# Patient Record
Sex: Female | Born: 1950 | Race: White | Hispanic: No | Marital: Married | Smoking: Former smoker
Health system: Southern US, Community
[De-identification: ages and names within clinical notes are randomized; demographics above are authoritative.]

## PROBLEM LIST (undated history)

## (undated) DIAGNOSIS — Z5189 Encounter for other specified aftercare: Secondary | ICD-10-CM

## (undated) DIAGNOSIS — IMO0002 Reserved for concepts with insufficient information to code with codable children: Secondary | ICD-10-CM

## (undated) DIAGNOSIS — Z87440 Personal history of urinary (tract) infections: Secondary | ICD-10-CM

## (undated) DIAGNOSIS — G629 Polyneuropathy, unspecified: Secondary | ICD-10-CM

## (undated) DIAGNOSIS — Z9221 Personal history of antineoplastic chemotherapy: Secondary | ICD-10-CM

## (undated) DIAGNOSIS — I10 Essential (primary) hypertension: Secondary | ICD-10-CM

## (undated) DIAGNOSIS — IMO0001 Reserved for inherently not codable concepts without codable children: Secondary | ICD-10-CM

## (undated) DIAGNOSIS — M1990 Unspecified osteoarthritis, unspecified site: Secondary | ICD-10-CM

## (undated) DIAGNOSIS — Z923 Personal history of irradiation: Secondary | ICD-10-CM

## (undated) DIAGNOSIS — C541 Malignant neoplasm of endometrium: Secondary | ICD-10-CM

## (undated) HISTORY — DX: Essential (primary) hypertension: I10

## (undated) HISTORY — DX: Unspecified osteoarthritis, unspecified site: M19.90

## (undated) HISTORY — DX: Malignant neoplasm of endometrium: C54.1

## (undated) HISTORY — PX: COLONOSCOPY: SHX174

## (undated) HISTORY — DX: Encounter for other specified aftercare: Z51.89

## (undated) HISTORY — DX: Polyneuropathy, unspecified: G62.9

## (undated) HISTORY — DX: Personal history of antineoplastic chemotherapy: Z92.21

## (undated) HISTORY — DX: Reserved for concepts with insufficient information to code with codable children: IMO0002

## (undated) HISTORY — DX: Personal history of urinary (tract) infections: Z87.440

## (undated) HISTORY — DX: Personal history of irradiation: Z92.3

## (undated) HISTORY — DX: Reserved for inherently not codable concepts without codable children: IMO0001

---

## 1998-09-21 ENCOUNTER — Other Ambulatory Visit: Admission: RE | Admit: 1998-09-21 | Discharge: 1998-09-21 | Payer: Self-pay | Admitting: Obstetrics and Gynecology

## 1999-02-23 ENCOUNTER — Other Ambulatory Visit: Admission: RE | Admit: 1999-02-23 | Discharge: 1999-02-23 | Payer: Self-pay | Admitting: Obstetrics and Gynecology

## 1999-02-23 ENCOUNTER — Encounter (INDEPENDENT_AMBULATORY_CARE_PROVIDER_SITE_OTHER): Payer: Self-pay | Admitting: Specialist

## 1999-06-15 ENCOUNTER — Other Ambulatory Visit: Admission: RE | Admit: 1999-06-15 | Discharge: 1999-06-15 | Payer: Self-pay | Admitting: Obstetrics and Gynecology

## 1999-08-31 ENCOUNTER — Other Ambulatory Visit: Admission: RE | Admit: 1999-08-31 | Discharge: 1999-08-31 | Payer: Self-pay | Admitting: Obstetrics and Gynecology

## 2000-10-29 ENCOUNTER — Other Ambulatory Visit: Admission: RE | Admit: 2000-10-29 | Discharge: 2000-10-29 | Payer: Self-pay | Admitting: Obstetrics and Gynecology

## 2001-11-06 ENCOUNTER — Other Ambulatory Visit: Admission: RE | Admit: 2001-11-06 | Discharge: 2001-11-06 | Payer: Self-pay | Admitting: Obstetrics and Gynecology

## 2002-12-10 ENCOUNTER — Other Ambulatory Visit: Admission: RE | Admit: 2002-12-10 | Discharge: 2002-12-10 | Payer: Self-pay | Admitting: Obstetrics and Gynecology

## 2004-01-27 ENCOUNTER — Other Ambulatory Visit: Admission: RE | Admit: 2004-01-27 | Discharge: 2004-01-27 | Payer: Self-pay | Admitting: Obstetrics and Gynecology

## 2009-03-15 ENCOUNTER — Emergency Department (HOSPITAL_COMMUNITY): Admission: EM | Admit: 2009-03-15 | Discharge: 2009-03-15 | Payer: Self-pay | Admitting: Family Medicine

## 2009-05-31 DIAGNOSIS — Z5189 Encounter for other specified aftercare: Secondary | ICD-10-CM

## 2009-05-31 DIAGNOSIS — IMO0001 Reserved for inherently not codable concepts without codable children: Secondary | ICD-10-CM

## 2009-05-31 HISTORY — PX: OTHER SURGICAL HISTORY: SHX169

## 2009-05-31 HISTORY — DX: Encounter for other specified aftercare: Z51.89

## 2009-05-31 HISTORY — PX: UPPER GASTROINTESTINAL ENDOSCOPY: SHX188

## 2009-05-31 HISTORY — DX: Reserved for inherently not codable concepts without codable children: IMO0001

## 2009-06-23 ENCOUNTER — Inpatient Hospital Stay (HOSPITAL_COMMUNITY): Admission: AD | Admit: 2009-06-23 | Discharge: 2009-06-26 | Payer: Self-pay | Admitting: Internal Medicine

## 2009-06-24 ENCOUNTER — Encounter (INDEPENDENT_AMBULATORY_CARE_PROVIDER_SITE_OTHER): Payer: Self-pay | Admitting: Internal Medicine

## 2009-06-24 ENCOUNTER — Encounter: Payer: Self-pay | Admitting: Internal Medicine

## 2009-06-26 ENCOUNTER — Ambulatory Visit: Payer: Self-pay | Admitting: Gastroenterology

## 2010-08-01 DIAGNOSIS — Z87440 Personal history of urinary (tract) infections: Secondary | ICD-10-CM

## 2010-08-01 HISTORY — DX: Personal history of urinary (tract) infections: Z87.440

## 2010-08-02 ENCOUNTER — Other Ambulatory Visit: Payer: Self-pay | Admitting: Emergency Medicine

## 2010-08-02 ENCOUNTER — Emergency Department (HOSPITAL_COMMUNITY)
Admission: EM | Admit: 2010-08-02 | Discharge: 2010-08-02 | Disposition: A | Discharge status: Home / Self Care | Payer: PRIVATE HEALTH INSURANCE | Attending: Emergency Medicine | Admitting: Emergency Medicine

## 2010-08-02 DIAGNOSIS — R55 Syncope and collapse: Secondary | ICD-10-CM | POA: Insufficient documentation

## 2010-08-02 DIAGNOSIS — Z8639 Personal history of other endocrine, nutritional and metabolic disease: Secondary | ICD-10-CM | POA: Insufficient documentation

## 2010-08-02 DIAGNOSIS — R1110 Vomiting, unspecified: Secondary | ICD-10-CM | POA: Insufficient documentation

## 2010-08-02 DIAGNOSIS — E119 Type 2 diabetes mellitus without complications: Secondary | ICD-10-CM | POA: Insufficient documentation

## 2010-08-02 DIAGNOSIS — I10 Essential (primary) hypertension: Secondary | ICD-10-CM | POA: Insufficient documentation

## 2010-08-02 DIAGNOSIS — N390 Urinary tract infection, site not specified: Secondary | ICD-10-CM | POA: Insufficient documentation

## 2010-08-02 DIAGNOSIS — Z862 Personal history of diseases of the blood and blood-forming organs and certain disorders involving the immune mechanism: Secondary | ICD-10-CM | POA: Insufficient documentation

## 2010-08-02 DIAGNOSIS — R509 Fever, unspecified: Secondary | ICD-10-CM | POA: Insufficient documentation

## 2010-08-02 LAB — URINALYSIS, ROUTINE W REFLEX MICROSCOPIC
Nitrite: NEGATIVE
Specific Gravity, Urine: 1.016 (ref 1.005–1.030)
Urobilinogen, UA: 0.2 mg/dL (ref 0.0–1.0)

## 2010-08-02 LAB — URINE MICROSCOPIC-ADD ON

## 2010-09-19 LAB — DIFFERENTIAL
Basophils Absolute: 0 10*3/uL (ref 0.0–0.1)
Eosinophils Relative: 0 % (ref 0–5)
Lymphocytes Relative: 7 % — ABNORMAL LOW (ref 12–46)
Lymphs Abs: 1.2 10*3/uL (ref 0.7–4.0)
Monocytes Absolute: 1.1 10*3/uL — ABNORMAL HIGH (ref 0.1–1.0)
Monocytes Relative: 6 % (ref 3–12)

## 2010-09-19 LAB — COMPREHENSIVE METABOLIC PANEL
ALT: 20 U/L (ref 0–35)
AST: 26 U/L (ref 0–37)
Albumin: 4.1 g/dL (ref 3.5–5.2)
CO2: 22 mEq/L (ref 19–32)
Calcium: 9.7 mg/dL (ref 8.4–10.5)
GFR calc non Af Amer: 39 mL/min — ABNORMAL LOW (ref >60)
Sodium: 131 mEq/L — ABNORMAL LOW (ref 135–145)

## 2010-09-19 LAB — CBC
MCH: 30.5 pg (ref 26.0–34.0)
Platelets: 246 10*3/uL (ref 150–400)
RBC: 4.06 MIL/uL (ref 3.87–5.11)
RDW: 14 % (ref 11.5–15.5)
WBC: 16.9 10*3/uL — ABNORMAL HIGH (ref 4.0–10.5)

## 2010-09-19 LAB — URINE CULTURE

## 2010-10-01 LAB — IRON AND TIBC: UIBC: 407 ug/dL

## 2010-10-01 LAB — COMPREHENSIVE METABOLIC PANEL
ALT: 30 U/L (ref 0–35)
AST: 33 U/L (ref 0–37)
AST: 49 U/L — ABNORMAL HIGH (ref 0–37)
Albumin: 3.1 g/dL — ABNORMAL LOW (ref 3.5–5.2)
CO2: 22 mEq/L (ref 19–32)
Calcium: 9.1 mg/dL (ref 8.4–10.5)
Calcium: 9.4 mg/dL (ref 8.4–10.5)
Chloride: 107 mEq/L (ref 96–112)
Chloride: 112 mEq/L (ref 96–112)
Creatinine, Ser: 1.23 mg/dL — ABNORMAL HIGH (ref 0.4–1.2)
GFR calc Af Amer: 55 mL/min — ABNORMAL LOW (ref >60)
GFR calc non Af Amer: 42 mL/min — ABNORMAL LOW (ref >60)
Potassium: 5.2 mEq/L — ABNORMAL HIGH (ref 3.5–5.1)
Total Protein: 6 g/dL (ref 6.0–8.3)

## 2010-10-01 LAB — CBC
HCT: 26.7 % — ABNORMAL LOW (ref 36.0–46.0)
Hemoglobin: 9.3 g/dL — ABNORMAL LOW (ref 12.0–15.0)
MCHC: 33.5 g/dL (ref 30.0–36.0)
MCHC: 34.1 g/dL (ref 30.0–36.0)
MCV: 94.4 fL (ref 78.0–100.0)
MCV: 94.5 fL (ref 78.0–100.0)
MCV: 97.9 fL (ref 78.0–100.0)
Platelets: 221 10*3/uL (ref 150–400)
Platelets: 252 10*3/uL (ref 150–400)
RDW: 17.1 % — ABNORMAL HIGH (ref 11.5–15.5)
RDW: 17.3 % — ABNORMAL HIGH (ref 11.5–15.5)
RDW: 18.4 % — ABNORMAL HIGH (ref 11.5–15.5)
RDW: 19.4 % — ABNORMAL HIGH (ref 11.5–15.5)
WBC: 6.9 10*3/uL (ref 4.0–10.5)
WBC: 8.3 10*3/uL (ref 4.0–10.5)

## 2010-10-01 LAB — BASIC METABOLIC PANEL
BUN: 15 mg/dL (ref 6–23)
CO2: 21 mEq/L (ref 19–32)
Creatinine, Ser: 1.3 mg/dL — ABNORMAL HIGH (ref 0.4–1.2)
GFR calc non Af Amer: 38 mL/min — ABNORMAL LOW (ref >60)
GFR calc non Af Amer: 42 mL/min — ABNORMAL LOW (ref >60)
Glucose, Bld: 102 mg/dL — ABNORMAL HIGH (ref 70–99)
Glucose, Bld: 165 mg/dL — ABNORMAL HIGH (ref 70–99)
Potassium: 5 mEq/L (ref 3.5–5.1)
Sodium: 134 mEq/L — ABNORMAL LOW (ref 135–145)

## 2010-10-01 LAB — CK TOTAL AND CKMB (NOT AT ARMC)
CK, MB: 1.2 ng/mL (ref 0.3–4.0)
CK, MB: 1.6 ng/mL (ref 0.3–4.0)
Total CK: 58 U/L (ref 7–177)

## 2010-10-01 LAB — CROSSMATCH: Antibody Screen: NEGATIVE

## 2010-10-01 LAB — TROPONIN I: Troponin I: 0.01 ng/mL (ref 0.00–0.06)

## 2010-10-01 LAB — PROTIME-INR
INR: 1.01 (ref 0.00–1.49)
Prothrombin Time: 13.2 seconds (ref 11.6–15.2)

## 2010-10-01 LAB — GLUCOSE, CAPILLARY
Glucose-Capillary: 119 mg/dL — ABNORMAL HIGH (ref 70–99)
Glucose-Capillary: 121 mg/dL — ABNORMAL HIGH (ref 70–99)
Glucose-Capillary: 209 mg/dL — ABNORMAL HIGH (ref 70–99)
Glucose-Capillary: 97 mg/dL (ref 70–99)

## 2010-10-01 LAB — FOLATE

## 2010-10-01 LAB — FERRITIN: Ferritin: 70 ng/mL (ref 10–291)

## 2010-10-01 LAB — RETICULOCYTES
RBC.: 2.07 MIL/uL — ABNORMAL LOW (ref 3.87–5.11)
Retic Ct Pct: 12 % — ABNORMAL HIGH (ref 0.4–3.1)

## 2010-10-01 LAB — HEMOCCULT GUIAC POC 1CARD (OFFICE): Fecal Occult Bld: NEGATIVE

## 2011-01-15 ENCOUNTER — Other Ambulatory Visit: Payer: Self-pay | Admitting: Obstetrics and Gynecology

## 2011-01-23 ENCOUNTER — Ambulatory Visit: Payer: PRIVATE HEALTH INSURANCE | Attending: Gynecologic Oncology | Admitting: Gynecologic Oncology

## 2011-01-23 DIAGNOSIS — G589 Mononeuropathy, unspecified: Secondary | ICD-10-CM | POA: Insufficient documentation

## 2011-01-23 DIAGNOSIS — C549 Malignant neoplasm of corpus uteri, unspecified: Secondary | ICD-10-CM | POA: Insufficient documentation

## 2011-01-23 DIAGNOSIS — I10 Essential (primary) hypertension: Secondary | ICD-10-CM | POA: Insufficient documentation

## 2011-01-23 DIAGNOSIS — Z794 Long term (current) use of insulin: Secondary | ICD-10-CM | POA: Insufficient documentation

## 2011-01-23 DIAGNOSIS — M109 Gout, unspecified: Secondary | ICD-10-CM | POA: Insufficient documentation

## 2011-01-23 DIAGNOSIS — E119 Type 2 diabetes mellitus without complications: Secondary | ICD-10-CM | POA: Insufficient documentation

## 2011-01-24 NOTE — Consult Note (Signed)
Lisa Morales, Lisa Morales NO.:  [id number]  MEDICAL RECORD NO.:  [id number]  LOCATION:  GYN                          FACILITY:  [facility]  PHYSICIAN:  Laurette Schimke, MD     DATE OF BIRTH:  21-Feb-1951  DATE OF CONSULTATION:  01/23/2011 DATE OF DISCHARGE:                                CONSULTATION   REFERRING PHYSICIAN:  Miguel Aschoff, M.D.  REASON FOR VISIT:  Consult is requested by Dr. Miguel Aschoff for evaluation of endometrial cancer.  HISTORY OF PRESENT ILLNESS:  This is a 60 year old gravida 1, para 1, last normal menstrual period approximately 10-15 years ago.  Lisa Morales reports vaginal spotting that was noted in June.  She was seen by Dr. Tenny Morales.  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  PAST MEDICAL HISTORY:  Insulin-dependent diabetes mellitus for 10 years, gout for 10 years, neuropathy for 10 years, peptic ulcer disease associated with nonsteroidal use, hypertension for 20 years.  PAST SURGICAL HISTORY:  Denies.  PAST GYN HISTORY:  Menarche at age of 57 with regular menses until menopause.  Reports use of oral contraceptive pills prior to her only pregnancy for 5 years.  No other hormonal contraceptive use.  SCREENING HISTORY:  Endoscopy and colonoscopy in December of 2010, notable for peptic ulcer disease.  SOCIAL HISTORY:  Tobacco use, denies.  Alcohol use occasionally.  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  Her husband is Pharmacist, hospital, self-employed and they are uninsured  MEDICATIONS: 1. Humulin R 100 units, 20 units at breakfast, 20 at supper and 20     units 30 minutes before meals. 2. Humulin N 100 units, 40 units at breakfast, 40 units at supper, 30     units prior to meals. 3. Neurontin 300 mg three times daily. 4. Allopurinol 300 mg daily. 5. Hydrochlorothiazide 12.5 mg 1 tablet daily. 6. Zostrix 0.075 1% to affected  areas. 7. Atenolol 100 mg daily. 8. Tylenol 500 mg p.r.n.  ALLERGIES:  No known drug allergies.  REVIEW OF SYSTEMS:  No fever, chills.  No diarrhea or constipation.  No weight loss.  No change in appetite.  No early satiety.  No bleeding from the rectum or the bladder.  No flank pain, neuropathy, shortness of breath, or chest pain.  PHYSICAL EXAMINATION:  GENERAL:  Well-developed female, in no acute distress.  Weight 243 pounds, height 5 feet 6 inches, body mass index of 39.  Blood pressure 128/70, pulse 78, temperature 97.9. CHEST:  Clear to auscultation. HEART:  Regular rate and rhythm. ABDOMEN:  Soft, obese.  No palpable fluid wave.  No omental cake. BACK:  No CVA tenderness. LYMPH NODE SURVEY:  No cervical, supraclavicular or inguinal adenopathy. HEART:  Regular rate and rhythm. PELVIC EXAMINATION:  Notable for skin discoloration in the folds of the inguinal area and underneath the pannus. PELVIC EXAMINATION:  Normal external genitalia, Bartholin's, urethra and Skene's.  Vagina without any lesions.  Cervix, small, roughly 2 cm without any gross lesions. RECTAL EXAMINATION:  Good anal sphincter tone without any masses.  The patient's body habitus precluded an assessment of uterine size.  EXTREMITIES:  No clubbing, cyanosis or edema.  IMPRESSION:  Grade 1 endometrial adenocarcinoma with squamous differentiation.  The options discussed with Lisa Morales was that abdominal hysterectomy and staging versus minimally invasive hysterectomy and staging.  The patient opts for the latter.  She is aware that there is a very small possibility that the procedure may not be able to be completed by minimally invasive approach.  The risks and benefits of the procedure discussed with her with that of infection, bleeding, damage to surrounding structures, prolonged hospitalization and reoperation.  All of her questions were answered to that of her and her husband.  It is expected that the  procedure will occur on February 19, 2011.  Lisa Morales were advised to seek financial counseling as this may alter their perspective of treatment options.  Thank you very much for allowing me to participate in the care of the patient.     Laurette Schimke, MD     WB/MEDQ  D:  01/23/2011  T:  01/23/2011  Job:  [id number]  cc:   Telford Nab, R.N. [address] [place], [place] [address]  Elby Showers, MD Fax: [phone]  Tonita Cong, M.D. Fax: [phone]  Miguel Aschoff, M.D.  Electronically Signed by Laurette Schimke MD on 01/24/2011 10:59:08 AM

## 2011-02-12 ENCOUNTER — Other Ambulatory Visit: Payer: Self-pay | Admitting: Obstetrics & Gynecology

## 2011-02-12 ENCOUNTER — Encounter (HOSPITAL_COMMUNITY): Payer: Self-pay

## 2011-02-12 ENCOUNTER — Ambulatory Visit (HOSPITAL_COMMUNITY)
Admission: RE | Admit: 2011-02-12 | Discharge: 2011-02-12 | Disposition: A | Discharge status: Home / Self Care | Payer: Self-pay | Source: Ambulatory Visit | Attending: Obstetrics & Gynecology | Admitting: Obstetrics & Gynecology

## 2011-02-12 DIAGNOSIS — Z01810 Encounter for preprocedural cardiovascular examination: Secondary | ICD-10-CM | POA: Insufficient documentation

## 2011-02-12 DIAGNOSIS — E119 Type 2 diabetes mellitus without complications: Secondary | ICD-10-CM | POA: Insufficient documentation

## 2011-02-12 DIAGNOSIS — Z01818 Encounter for other preprocedural examination: Secondary | ICD-10-CM | POA: Insufficient documentation

## 2011-02-12 DIAGNOSIS — C55 Malignant neoplasm of uterus, part unspecified: Secondary | ICD-10-CM | POA: Insufficient documentation

## 2011-02-12 DIAGNOSIS — I10 Essential (primary) hypertension: Secondary | ICD-10-CM | POA: Insufficient documentation

## 2011-02-12 DIAGNOSIS — Z01812 Encounter for preprocedural laboratory examination: Secondary | ICD-10-CM | POA: Insufficient documentation

## 2011-02-12 LAB — COMPREHENSIVE METABOLIC PANEL
ALT: 18 U/L (ref 0–35)
AST: 19 U/L (ref 0–37)
Albumin: 3.9 g/dL (ref 3.5–5.2)
CO2: 29 mEq/L (ref 19–32)
Calcium: 10.5 mg/dL (ref 8.4–10.5)
Creatinine, Ser: 1.02 mg/dL (ref 0.50–1.10)
GFR calc non Af Amer: 56 mL/min — ABNORMAL LOW (ref >60)
Sodium: 139 mEq/L (ref 135–145)
Total Protein: 8.1 g/dL (ref 6.0–8.3)

## 2011-02-12 LAB — DIFFERENTIAL
Basophils Relative: 0 % (ref 0–1)
Eosinophils Absolute: 0.3 10*3/uL (ref 0.0–0.7)
Eosinophils Relative: 3 % (ref 0–5)
Monocytes Relative: 7 % (ref 3–12)
Neutrophils Relative %: 65 % (ref 43–77)

## 2011-02-12 LAB — CBC
MCH: 29.6 pg (ref 26.0–34.0)
Platelets: 302 10*3/uL (ref 150–400)
RBC: 4.22 MIL/uL (ref 3.87–5.11)
RDW: 13.9 % (ref 11.5–15.5)

## 2011-02-12 LAB — SURGICAL PCR SCREEN: Staphylococcus aureus: POSITIVE — AB

## 2011-02-12 LAB — ABO/RH: ABO/RH(D): O POS

## 2011-02-19 ENCOUNTER — Ambulatory Visit (HOSPITAL_COMMUNITY)
Admission: RE | Admit: 2011-02-19 | Discharge: 2011-02-20 | Disposition: A | Discharge status: Home / Self Care | Payer: Self-pay | Source: Ambulatory Visit | Attending: Obstetrics & Gynecology | Admitting: Obstetrics & Gynecology

## 2011-02-19 ENCOUNTER — Other Ambulatory Visit: Payer: Self-pay | Admitting: Gynecologic Oncology

## 2011-02-19 DIAGNOSIS — C549 Malignant neoplasm of corpus uteri, unspecified: Secondary | ICD-10-CM | POA: Insufficient documentation

## 2011-02-19 DIAGNOSIS — E669 Obesity, unspecified: Secondary | ICD-10-CM | POA: Insufficient documentation

## 2011-02-19 DIAGNOSIS — Z01812 Encounter for preprocedural laboratory examination: Secondary | ICD-10-CM | POA: Insufficient documentation

## 2011-02-19 DIAGNOSIS — E109 Type 1 diabetes mellitus without complications: Secondary | ICD-10-CM | POA: Insufficient documentation

## 2011-02-19 DIAGNOSIS — Z79899 Other long term (current) drug therapy: Secondary | ICD-10-CM | POA: Insufficient documentation

## 2011-02-19 DIAGNOSIS — I10 Essential (primary) hypertension: Secondary | ICD-10-CM | POA: Insufficient documentation

## 2011-02-19 HISTORY — PX: ABDOMINAL HYSTERECTOMY: SHX81

## 2011-02-19 LAB — TYPE AND SCREEN: Antibody Screen: NEGATIVE

## 2011-02-19 LAB — GLUCOSE, CAPILLARY
Glucose-Capillary: 144 mg/dL — ABNORMAL HIGH (ref 70–99)
Glucose-Capillary: 258 mg/dL — ABNORMAL HIGH (ref 70–99)

## 2011-02-20 LAB — BASIC METABOLIC PANEL
BUN: 14 mg/dL (ref 6–23)
Calcium: 9.4 mg/dL (ref 8.4–10.5)
Creatinine, Ser: 0.93 mg/dL (ref 0.50–1.10)
GFR calc non Af Amer: >60 mL/min (ref >60)
Glucose, Bld: 206 mg/dL — ABNORMAL HIGH (ref 70–99)
Sodium: 139 mEq/L (ref 135–145)

## 2011-02-20 LAB — CBC
HCT: 33.7 % — ABNORMAL LOW (ref 36.0–46.0)
Hemoglobin: 10.9 g/dL — ABNORMAL LOW (ref 12.0–15.0)
MCH: 29.9 pg (ref 26.0–34.0)
MCHC: 32.3 g/dL (ref 30.0–36.0)

## 2011-02-26 NOTE — Op Note (Signed)
Lisa Morales, Lisa Morales                ACCOUNT NO.:  [id number]  MEDICAL RECORD NO.:  [id number]  LOCATION:  1523                         FACILITY:  [facility]  PHYSICIAN:  Laurette Schimke, MD     DATE OF BIRTH:  1950/10/16  DATE OF PROCEDURE:  02/19/2011 DATE OF DISCHARGE:                              OPERATIVE REPORT   PREOPERATIVE DIAGNOSIS:  Endometrial cancer, grade 1.  POSTOPERATIVE DIAGNOSIS:  Endometrial cancer, stage IIIA.  PROCEDURE:  Robotic assisted laparoscopic hysterectomy, bilateral salpingo-oophorectomy, right pelvic lymph node sampling.  SURGEON:  Laurette Schimke, M.D.  ASSISTANTS:  Roseanna Rainbow, M.D. and Telford Nab, R.N.  ANESTHESIA:  General endotracheal.  FINDINGS:  Of upon entry into the abdominal cavity, a left fundal/broad ligament fibroid was appreciated.  Additionally, the left fallopian tube appeared engorged and adherent to the round ligament and pelvic sidewall.  Frozen section was consistent with adenocarcinoma.  DESCRIPTION OF PROCEDURE:  The patient was taken to operating room and placed under general endotracheal anesthesia without any difficulty. The patient's central abdominal girth was noted to be quite significant. She was placed in Trendelenburg position and tolerated this positioning. As such, it was determined to proceed with robotic procedure.  She was placed in normal position and the abdomen and pelvis prepped.  The uterus was interrogated and the uterine manipulator inserted along with a small vaginal KOH ring.  She was then draped in the usual sterile fashion.  An incision was made just 3 cm inferior to the left sternal margin and an Optiview trocar inserted under direct visualization.  The abdomen was insufflated.  Approximately 2 cm above the umbilicus, incisions were made 10 cm lateral inferior to the midline supraumbilical incision and also along the left pelvic sidewall just superior to the anterior superior iliac spine.   Trocars were inserted.  The patient was placed in Trendelenburg.  An attempt was made to remove the small and large intestine from the operative field.  The abdomen was insufflated to approximately 15 mmHg.  With this insufflation, the patient was noted not to be able to tolerate the positioning.  The Trendelenburg position was modified with return of the intestinal contents to the pelvis.  With great difficulty, the small bowel was removed out of the abdominal field.  The robot was then docked and instruments placed.  Difficulty with aeration was once again appreciated and intraabdominal pressure was lowered to 12.  The patient was able to tolerate this.  The right incision was made in the right round ligament and at that time, the right adnexa was noted to be adherent to the pelvic side wall and the round ligament.  This stricture was dissected using cautery and a biopsy of the distal portion sent for pathology.  The infundibulopelvic ligament was skeletonized and cauterized and transected.  The fourth robot arm throughout the case was used to limit the descent of the redundant rectosigmoid into the pelvis.  The facility and safety of the surgery was significantly compromised by the amount of visceral adiposity and the need to constantly remove the intestinal contents from the field.  The broad ligament on the right was skeletonized.  The bladder flap was dissected.  The vessels were cauterized and transected. In a similar fashion, the left round ligament was transected.  Adhesions were noted between the adnexa, the bowel, and the sidewall, and these were sharply taken.  The infundibulopelvic ligament was skeletonized and cauterized and transected.  The broad ligament was skeletonized.  The bladder flap was completed.  A colpotomy incision was made and the specimen resected from its attachments to the vagina.  The specimen was delivered.  Of note, the right adnexa was transected lateral  of the uteroovarian ligament, placed in a bag immediately after dissection of the infundibulopelvic ligament.  The specimen was removed and the balloon replaced within the vagina.  An attempt was then made to perform the bilateral pelvic lymph node dissection.  The significant amount of visceral adipose tissue made identification of the obturator only faintly visible.  Similarly, the positioning and the amount of fat did not allow for visualization of the external iliac artery and vein. Pulsatile activity was appreciated.  However, was not deemed safe to proceed given the inability to easily visualize these structures. Tissue was removed from the right external iliac artery, placed in a bag and removed from the vagina.  The abdomen was copiously irrigated and drained and the cuff closed with a running Vicryl suture ligature. Hemostasis was once again assured and the instruments removed from the abdomen.  The abdomen desufflated.  The incision sites were closed with Vicryl one deep subcutaneous layer and the second superficial subcuticular layer.  Sponge, instrument, and needle counts were correct x3.  The patient was extubated.  The vaginal cuff was wiped with a sponge stick.  SPECIMENS:  Uterus, cervix, ovaries, tubes, right external iliac lymph nodes.  DRAINS:  Foley, draining clear urine.  DISPOSITION:  The patient was taken to recovery room in stable condition.  COMPLICATIONS:  None.     Laurette Schimke, MD     WB/MEDQ  D:  02/19/2011  T:  02/19/2011  Job:  [id number]  cc: Miguel Aschoff, MD   Roseanna Rainbow, M.D. Fax: [phone]  Telford Nab, R.N. [address] [place], [place] [address]  Electronically Signed by Laurette Schimke MD on 02/26/2011 02:25:51 PM

## 2011-03-07 ENCOUNTER — Ambulatory Visit: Payer: Self-pay | Attending: Gynecologic Oncology | Admitting: Gynecologic Oncology

## 2011-03-07 DIAGNOSIS — C7960 Secondary malignant neoplasm of unspecified ovary: Secondary | ICD-10-CM | POA: Insufficient documentation

## 2011-03-07 DIAGNOSIS — Z90710 Acquired absence of both cervix and uterus: Secondary | ICD-10-CM | POA: Insufficient documentation

## 2011-03-07 DIAGNOSIS — C549 Malignant neoplasm of corpus uteri, unspecified: Secondary | ICD-10-CM | POA: Insufficient documentation

## 2011-03-07 DIAGNOSIS — I10 Essential (primary) hypertension: Secondary | ICD-10-CM | POA: Insufficient documentation

## 2011-03-07 DIAGNOSIS — C7982 Secondary malignant neoplasm of genital organs: Secondary | ICD-10-CM | POA: Insufficient documentation

## 2011-03-07 DIAGNOSIS — Z794 Long term (current) use of insulin: Secondary | ICD-10-CM | POA: Insufficient documentation

## 2011-03-07 DIAGNOSIS — E119 Type 2 diabetes mellitus without complications: Secondary | ICD-10-CM | POA: Insufficient documentation

## 2011-03-08 NOTE — Consult Note (Signed)
  Lisa Morales, Lisa Morales                ACCOUNT NO.:  [id number]  MEDICAL RECORD NO.:  [id number]  LOCATION:  GYN                          FACILITY:  [facility]  PHYSICIAN:  Laurette Schimke, MD     DATE OF BIRTH:  [birth year]  DATE OF CONSULTATION:  03/07/2011 DATE OF DISCHARGE:                                CONSULTATION   REASON FOR VISIT:  Postoperative check, treatment planning.  HISTORY OF PRESENT ILLNESS:  This is a 60 year old gravida 1, para 1, last normal menstrual period approximately 15 years ago, vaginal spotting was appreciated in June 2011.  Subsequent endometrial biopsy performed by Dr. Tenny Craw was notable for grade 1 endometrial cancer.  On February 19, 2011, she underwent a robotic-assisted laparoscopic hysterectomy, bilateral salpingo-oophorectomy, right pelvic lymph node sampling.  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  The tumor was grade 1 with lymphovascular space invasion and invaded myometrial wall 2 cm where the myometrium was 2.5 cm.  This correlates an 80% myometrial wall thickness.  Postoperatively, Lisa Morales has done well and she presents today for postoperative evaluation and treatment planning.  PAST MEDICAL HISTORY: 1. Insulin-dependent diabetes mellitus. 2. Peptic ulcer disease. 3. Hypertension. 4. Gout. 5. Neuropathy.  PAST SURGICAL HISTORY: 1. Interval robotic-assisted laparoscopic hysterectomy. 2. Bilateral salpingo-oophorectomy. 3. Right pelvic lymph node dissection.  REVIEW OF SYSTEMS:  No nausea, vomiting, no fever or chills.  Denies any vaginal bleeding.  No diarrhea or constipation.  Denies hematuria, hematochezia, or flank pain.  PHYSICAL EXAMINATION:  GENERAL:  Well-developed female, in no acute distress. VITAL SIGNS:  Weight 245 pounds, blood pressure 128/64, pulse 72, respiratory rate 16, temperature 98.6. CHEST:  Clear to auscultation. ABDOMEN:  Soft, nontender,  evidence of allergic reaction to the Dermabond placed on the lower abdominal incision.  Left upper quadrant incision is clean, dry, and intact without any erythema. PELVIC EXAMINATION:  Vaginal cuff intact, some tenderness.  No discharge, no bleeding.  No cul-de-sac masses.  IMPRESSION:  Lisa Morales has a stage IIIA grade 1 endometrial cancer with involvement of the right ovary and fallopian tubes, 80% myometrial invasion and lymphovascular space invasion.  The plan is administration of sandwich therapy, chemotherapy punctuated by external beam therapy followed by additional chemotherapy.  A visit with Dr. Darrold Span will be scheduled to facilitate administration of Taxol carboplatin therapy.  Lisa Morales was also directed to financial services.  Once chemotherapy is underway, a referral will be made to Dr. Antony Blackbird for external beam radiation therapy.     Laurette Schimke, MD     WB/MEDQ  D:  03/07/2011  T:  03/08/2011  Job:  [id number]  cc:   Telford Nab, R.N. [address] [place], [place] [address]  Miguel Aschoff, M.D.  Elby Showers, MD [phone]  Tonita Cong, MD [phone] Electronically Signed by Laurette Schimke MD on 03/08/2011 10:11:41 AM

## 2011-04-03 ENCOUNTER — Other Ambulatory Visit: Payer: Self-pay | Admitting: Oncology

## 2011-04-03 ENCOUNTER — Encounter (HOSPITAL_BASED_OUTPATIENT_CLINIC_OR_DEPARTMENT_OTHER): Payer: Self-pay | Admitting: Oncology

## 2011-04-03 DIAGNOSIS — E119 Type 2 diabetes mellitus without complications: Secondary | ICD-10-CM

## 2011-04-03 DIAGNOSIS — I10 Essential (primary) hypertension: Secondary | ICD-10-CM

## 2011-04-03 DIAGNOSIS — C549 Malignant neoplasm of corpus uteri, unspecified: Secondary | ICD-10-CM

## 2011-04-03 DIAGNOSIS — G609 Hereditary and idiopathic neuropathy, unspecified: Secondary | ICD-10-CM

## 2011-04-03 LAB — CBC WITH DIFFERENTIAL/PLATELET
BASO%: 0.4 % (ref 0.0–2.0)
EOS%: 2.1 % (ref 0.0–7.0)
HCT: 36.5 % (ref 34.8–46.6)
LYMPH%: 20.4 % (ref 14.0–49.7)
MCH: 31.1 pg (ref 25.1–34.0)
MCHC: 34.4 g/dL (ref 31.5–36.0)
MCV: 90.4 fL (ref 79.5–101.0)
NEUT%: 70 % (ref 38.4–76.8)
Platelets: 299 10*3/uL (ref 145–400)

## 2011-04-03 LAB — COMPREHENSIVE METABOLIC PANEL
ALT: 25 U/L (ref 0–35)
AST: 32 U/L (ref 0–37)
BUN: 37 mg/dL — ABNORMAL HIGH (ref 6–23)
CO2: 22 mEq/L (ref 19–32)
Creatinine, Ser: 1.26 mg/dL — ABNORMAL HIGH (ref 0.50–1.10)
Total Bilirubin: 0.4 mg/dL (ref 0.3–1.2)

## 2011-04-09 ENCOUNTER — Encounter (HOSPITAL_BASED_OUTPATIENT_CLINIC_OR_DEPARTMENT_OTHER): Payer: Self-pay | Admitting: Oncology

## 2011-04-09 ENCOUNTER — Other Ambulatory Visit: Payer: Self-pay | Admitting: Oncology

## 2011-04-09 DIAGNOSIS — C549 Malignant neoplasm of corpus uteri, unspecified: Secondary | ICD-10-CM

## 2011-04-09 LAB — BASIC METABOLIC PANEL
CO2: 25 mEq/L (ref 19–32)
Calcium: 10.2 mg/dL (ref 8.4–10.5)
Chloride: 100 mEq/L (ref 96–112)
Glucose, Bld: 207 mg/dL — ABNORMAL HIGH (ref 70–99)
Sodium: 136 mEq/L (ref 135–145)

## 2011-04-15 ENCOUNTER — Other Ambulatory Visit: Payer: Self-pay | Admitting: Oncology

## 2011-04-15 ENCOUNTER — Encounter (HOSPITAL_BASED_OUTPATIENT_CLINIC_OR_DEPARTMENT_OTHER): Payer: Self-pay | Admitting: Oncology

## 2011-04-15 DIAGNOSIS — C549 Malignant neoplasm of corpus uteri, unspecified: Secondary | ICD-10-CM

## 2011-04-15 DIAGNOSIS — E119 Type 2 diabetes mellitus without complications: Secondary | ICD-10-CM

## 2011-04-15 DIAGNOSIS — Z5111 Encounter for antineoplastic chemotherapy: Secondary | ICD-10-CM

## 2011-04-15 LAB — CBC WITH DIFFERENTIAL/PLATELET
BASO%: 0.7 % (ref 0.0–2.0)
Basophils Absolute: 0.1 10*3/uL (ref 0.0–0.1)
EOS%: 0.1 % (ref 0.0–7.0)
HCT: 37.7 % (ref 34.8–46.6)
LYMPH%: 8.4 % — ABNORMAL LOW (ref 14.0–49.7)
MCH: 30.6 pg (ref 25.1–34.0)
MCHC: 33.3 g/dL (ref 31.5–36.0)
MCV: 92 fL (ref 79.5–101.0)
MONO%: 2.8 % (ref 0.0–14.0)
NEUT%: 88 % — ABNORMAL HIGH (ref 38.4–76.8)
lymph#: 1.4 10*3/uL (ref 0.9–3.3)

## 2011-04-15 LAB — WHOLE BLOOD GLUCOSE: Glucose: 349 mg/dL — ABNORMAL HIGH (ref 70–100)

## 2011-04-16 ENCOUNTER — Encounter (HOSPITAL_BASED_OUTPATIENT_CLINIC_OR_DEPARTMENT_OTHER): Payer: Self-pay | Admitting: Oncology

## 2011-04-16 DIAGNOSIS — Z5189 Encounter for other specified aftercare: Secondary | ICD-10-CM

## 2011-04-16 DIAGNOSIS — C549 Malignant neoplasm of corpus uteri, unspecified: Secondary | ICD-10-CM

## 2011-04-22 ENCOUNTER — Encounter (HOSPITAL_BASED_OUTPATIENT_CLINIC_OR_DEPARTMENT_OTHER): Payer: Self-pay | Admitting: Oncology

## 2011-04-22 ENCOUNTER — Other Ambulatory Visit: Payer: Self-pay | Admitting: Oncology

## 2011-04-22 DIAGNOSIS — Z794 Long term (current) use of insulin: Secondary | ICD-10-CM

## 2011-04-22 DIAGNOSIS — G609 Hereditary and idiopathic neuropathy, unspecified: Secondary | ICD-10-CM

## 2011-04-22 DIAGNOSIS — C549 Malignant neoplasm of corpus uteri, unspecified: Secondary | ICD-10-CM

## 2011-04-22 DIAGNOSIS — E119 Type 2 diabetes mellitus without complications: Secondary | ICD-10-CM

## 2011-04-22 LAB — CBC WITH DIFFERENTIAL/PLATELET
Basophils Absolute: 0.1 10*3/uL (ref 0.0–0.1)
EOS%: 0.7 % (ref 0.0–7.0)
Eosinophils Absolute: 0.2 10*3/uL (ref 0.0–0.5)
HCT: 39.5 % (ref 34.8–46.6)
HGB: 13.3 g/dL (ref 11.6–15.9)
LYMPH%: 11 % — ABNORMAL LOW (ref 14.0–49.7)
MCH: 30.3 pg (ref 25.1–34.0)
MCV: 89.9 fL (ref 79.5–101.0)
MONO%: 6.8 % (ref 0.0–14.0)
NEUT#: 19.5 10*3/uL — ABNORMAL HIGH (ref 1.5–6.5)
NEUT%: 80.9 % — ABNORMAL HIGH (ref 38.4–76.8)
Platelets: 264 10*3/uL (ref 145–400)

## 2011-04-22 LAB — COMPREHENSIVE METABOLIC PANEL
AST: 29 U/L (ref 0–37)
Albumin: 4.5 g/dL (ref 3.5–5.2)
Alkaline Phosphatase: 171 U/L — ABNORMAL HIGH (ref 39–117)
BUN: 36 mg/dL — ABNORMAL HIGH (ref 6–23)
Creatinine, Ser: 1.16 mg/dL — ABNORMAL HIGH (ref 0.50–1.10)
Glucose, Bld: 95 mg/dL (ref 70–99)
Potassium: 4.9 mEq/L (ref 3.5–5.3)

## 2011-04-25 ENCOUNTER — Other Ambulatory Visit: Payer: Self-pay | Admitting: Oncology

## 2011-04-28 ENCOUNTER — Other Ambulatory Visit: Payer: Self-pay | Admitting: Oncology

## 2011-04-28 ENCOUNTER — Encounter: Payer: Self-pay | Admitting: Oncology

## 2011-04-28 DIAGNOSIS — C541 Malignant neoplasm of endometrium: Secondary | ICD-10-CM

## 2011-04-28 HISTORY — DX: Malignant neoplasm of endometrium: C54.1

## 2011-04-30 ENCOUNTER — Encounter (HOSPITAL_BASED_OUTPATIENT_CLINIC_OR_DEPARTMENT_OTHER): Payer: Self-pay | Admitting: Oncology

## 2011-04-30 ENCOUNTER — Other Ambulatory Visit: Payer: Self-pay | Admitting: Oncology

## 2011-04-30 DIAGNOSIS — C549 Malignant neoplasm of corpus uteri, unspecified: Secondary | ICD-10-CM

## 2011-04-30 LAB — COMPREHENSIVE METABOLIC PANEL
Albumin: 4.2 g/dL (ref 3.5–5.2)
BUN: 35 mg/dL — ABNORMAL HIGH (ref 6–23)
Calcium: 10 mg/dL (ref 8.4–10.5)
Chloride: 102 mEq/L (ref 96–112)
Creatinine, Ser: 1.11 mg/dL — ABNORMAL HIGH (ref 0.50–1.10)
Glucose, Bld: 210 mg/dL — ABNORMAL HIGH (ref 70–99)
Potassium: 4.6 mEq/L (ref 3.5–5.3)

## 2011-04-30 LAB — CBC WITH DIFFERENTIAL/PLATELET
Basophils Absolute: 0.1 10*3/uL (ref 0.0–0.1)
EOS%: 0.5 % (ref 0.0–7.0)
Eosinophils Absolute: 0.1 10*3/uL (ref 0.0–0.5)
HCT: 37.4 % (ref 34.8–46.6)
HGB: 12.5 g/dL (ref 11.6–15.9)
MCH: 29.8 pg (ref 25.1–34.0)
MCV: 89.3 fL (ref 79.5–101.0)
NEUT#: 11.5 10*3/uL — ABNORMAL HIGH (ref 1.5–6.5)
NEUT%: 80.7 % — ABNORMAL HIGH (ref 38.4–76.8)
RDW: 15.1 % — ABNORMAL HIGH (ref 11.2–14.5)
lymph#: 2 10*3/uL (ref 0.9–3.3)

## 2011-05-05 ENCOUNTER — Other Ambulatory Visit: Payer: Self-pay | Admitting: Oncology

## 2011-05-06 ENCOUNTER — Ambulatory Visit (HOSPITAL_BASED_OUTPATIENT_CLINIC_OR_DEPARTMENT_OTHER): Payer: Self-pay

## 2011-05-06 ENCOUNTER — Other Ambulatory Visit: Payer: Self-pay | Admitting: Oncology

## 2011-05-06 ENCOUNTER — Other Ambulatory Visit (HOSPITAL_BASED_OUTPATIENT_CLINIC_OR_DEPARTMENT_OTHER): Payer: Self-pay

## 2011-05-06 VITALS — BP 147/78 | HR 74 | Temp 97.1°F

## 2011-05-06 DIAGNOSIS — C541 Malignant neoplasm of endometrium: Secondary | ICD-10-CM

## 2011-05-06 DIAGNOSIS — C549 Malignant neoplasm of corpus uteri, unspecified: Secondary | ICD-10-CM

## 2011-05-06 DIAGNOSIS — Z5111 Encounter for antineoplastic chemotherapy: Secondary | ICD-10-CM

## 2011-05-06 LAB — WHOLE BLOOD GLUCOSE: HRS PC: 0.5 Hours

## 2011-05-06 LAB — CBC WITH DIFFERENTIAL/PLATELET
Basophils Absolute: 0 10*3/uL (ref 0.0–0.1)
EOS%: 0 % (ref 0.0–7.0)
Eosinophils Absolute: 0 10*3/uL (ref 0.0–0.5)
HCT: 34.6 % — ABNORMAL LOW (ref 34.8–46.6)
HGB: 11.6 g/dL (ref 11.6–15.9)
MONO#: 0.6 10*3/uL (ref 0.1–0.9)
NEUT#: 14 10*3/uL — ABNORMAL HIGH (ref 1.5–6.5)
NEUT%: 86.8 % — ABNORMAL HIGH (ref 38.4–76.8)
RDW: 15.3 % — ABNORMAL HIGH (ref 11.2–14.5)
WBC: 16.2 10*3/uL — ABNORMAL HIGH (ref 3.9–10.3)
lymph#: 1.5 10*3/uL (ref 0.9–3.3)

## 2011-05-06 MED ORDER — SODIUM CHLORIDE 0.9 % IV SOLN
53.0000 mg/m2 | Freq: Once | INTRAVENOUS | Status: AC
  Administered 2011-05-06: 120 mg via INTRAVENOUS
  Filled 2011-05-06: qty 12

## 2011-05-06 MED ORDER — INSULIN REGULAR HUMAN 100 UNIT/ML IJ SOLN
2.0000 [IU] | Freq: Once | INTRAMUSCULAR | Status: AC
  Administered 2011-05-06: 2 [IU] via SUBCUTANEOUS
  Filled 2011-05-06: qty 0.02

## 2011-05-06 MED ORDER — ONDANSETRON 16 MG/50ML IVPB (CHCC)
16.0000 mg | Freq: Once | INTRAVENOUS | Status: AC
  Administered 2011-05-06: 16 mg via INTRAVENOUS

## 2011-05-06 MED ORDER — SODIUM CHLORIDE 0.9 % IV SOLN
350.7000 mg | Freq: Once | INTRAVENOUS | Status: AC
  Administered 2011-05-06: 350 mg via INTRAVENOUS
  Filled 2011-05-06: qty 35

## 2011-05-06 MED ORDER — DEXAMETHASONE SODIUM PHOSPHATE 4 MG/ML IJ SOLN
10.0000 mg | Freq: Once | INTRAMUSCULAR | Status: AC
  Administered 2011-05-06: 10 mg via INTRAVENOUS

## 2011-05-06 NOTE — Patient Instructions (Signed)
Patient aware of next appointment and to take nausea meds as ordered.

## 2011-05-07 ENCOUNTER — Ambulatory Visit (HOSPITAL_BASED_OUTPATIENT_CLINIC_OR_DEPARTMENT_OTHER): Payer: Self-pay

## 2011-05-07 ENCOUNTER — Encounter: Payer: Self-pay | Admitting: Certified Registered Nurse Anesthetist

## 2011-05-07 VITALS — BP 126/76 | HR 75 | Temp 97.9°F

## 2011-05-07 DIAGNOSIS — C541 Malignant neoplasm of endometrium: Secondary | ICD-10-CM

## 2011-05-07 DIAGNOSIS — C549 Malignant neoplasm of corpus uteri, unspecified: Secondary | ICD-10-CM

## 2011-05-07 DIAGNOSIS — Z5189 Encounter for other specified aftercare: Secondary | ICD-10-CM

## 2011-05-07 MED ORDER — PEGFILGRASTIM INJECTION 6 MG/0.6ML
6.0000 mg | Freq: Once | SUBCUTANEOUS | Status: AC
  Administered 2011-05-07: 6 mg via SUBCUTANEOUS
  Filled 2011-05-07: qty 0.6

## 2011-05-20 ENCOUNTER — Other Ambulatory Visit: Payer: Self-pay | Admitting: Lab

## 2011-05-20 ENCOUNTER — Ambulatory Visit (HOSPITAL_BASED_OUTPATIENT_CLINIC_OR_DEPARTMENT_OTHER): Payer: Self-pay | Admitting: Oncology

## 2011-05-20 ENCOUNTER — Other Ambulatory Visit: Payer: Self-pay | Admitting: Oncology

## 2011-05-20 ENCOUNTER — Other Ambulatory Visit (HOSPITAL_COMMUNITY): Payer: Self-pay | Admitting: Oncology

## 2011-05-20 VITALS — BP 145/74 | HR 79 | Temp 97.9°F | Wt 246.6 lb

## 2011-05-20 DIAGNOSIS — C541 Malignant neoplasm of endometrium: Secondary | ICD-10-CM

## 2011-05-20 DIAGNOSIS — E119 Type 2 diabetes mellitus without complications: Secondary | ICD-10-CM

## 2011-05-20 DIAGNOSIS — G609 Hereditary and idiopathic neuropathy, unspecified: Secondary | ICD-10-CM

## 2011-05-20 DIAGNOSIS — C549 Malignant neoplasm of corpus uteri, unspecified: Secondary | ICD-10-CM

## 2011-05-20 DIAGNOSIS — I10 Essential (primary) hypertension: Secondary | ICD-10-CM

## 2011-05-20 LAB — CBC WITH DIFFERENTIAL/PLATELET
BASO%: 1.2 % (ref 0.0–2.0)
Basophils Absolute: 0.2 10*3/uL — ABNORMAL HIGH (ref 0.0–0.1)
EOS%: 0.3 % (ref 0.0–7.0)
HGB: 11.7 g/dL (ref 11.6–15.9)
MCH: 31.2 pg (ref 25.1–34.0)
MCHC: 34.2 g/dL (ref 31.5–36.0)
MONO#: 0.4 10*3/uL (ref 0.1–0.9)
RDW: 17.2 % — ABNORMAL HIGH (ref 11.2–14.5)
WBC: 14.1 10*3/uL — ABNORMAL HIGH (ref 3.9–10.3)
lymph#: 1.8 10*3/uL (ref 0.9–3.3)

## 2011-05-20 LAB — COMPREHENSIVE METABOLIC PANEL
Albumin: 4.2 g/dL (ref 3.5–5.2)
BUN: 32 mg/dL — ABNORMAL HIGH (ref 6–23)
CO2: 27 mEq/L (ref 19–32)
Calcium: 9.7 mg/dL (ref 8.4–10.5)
Chloride: 104 mEq/L (ref 96–112)
Glucose, Bld: 166 mg/dL — ABNORMAL HIGH (ref 70–99)
Potassium: 4.9 mEq/L (ref 3.5–5.3)

## 2011-05-20 NOTE — Progress Notes (Signed)
OFFICE PROGRESS NOTE  INTERVAL HISTORY:  Date of Visit May 20, 2011  Patient is seen, alone for visit today, in continuing attention to her Stage IIIA grade 1 endometrial carcinoma, for which she is post robotic assisted laparoscopic hysterectomy, BSO and right pelvic node evaluation by Dr. Laurette Schimke on Feb 19, 2011 and is due third cycle of taxotere + carboplatin on Nov.26, 2012. Dr.Brewster had recommended 3 cycles of chemotherapy, then radiation, then 3 additional cycles of chemo. Taxotere is being used in preference to taxol due to preexisting diabetic neuropathy symptoms. I do not believe that she has been seen by radiation oncology yet. Patient tolerated the second cycle of taxotere+ carboplatin Nov.5 again very well, with neulasta given day 2. She had minimal nausea twice, improved with po antiemetics each time. She has been eating well without significant taste disturbance. 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 She denies fever or symptoms of infection, no increased SOB or respiratory symptoms, bowels moving 2 - 3 times daily which she thinks is related to high fiber diet, no bladder symptoms, no bleeding, no abdominal or pelvic pain, no swelling LE. Blood sugars were in 300 range around the chemotherapy (uses decadron 8mg  bid x 3 d beginning day prior to taxotere) which was covered with insulin and returned to her usual range promptly when she was off steroids. She bit the side of her tongue, has been using mouthwash for dry mouth.   Objective:  Vital signs in last 24 hours:  BP 145/74  Pulse 79  Temp(Src) 97.9 F (36.6 C) (Oral)  Wt 246 lb 9.6 oz (111.857 kg) Ambulatory easily in office, looks comfortable, very pleasant as always   HEENT:mucous membranes moist, pharynx normal without lesions. Partial alopecia. PERR. Tongue with minimal excoriation left lateral. LymphaticsCervical, supraclavicular, and axillary nodes normal.No  appreciable inguinal adenopathy. Resp: clear to auscultation bilaterally and normal percussion bilaterally Cardio: regular rate and rhythm GI: soft, non-tender; bowel sounds normal; no masses,  no organomegaly. Obese. Laproscopic incisions all well-healed. Extremities: trace pedal edema without cords or tenderness Neuro:no focal deficits. Skin: bruises anterior abdomen from insulin injections. No petechiae or rash. Portacath/PICC-without erythema  Lab Results:   Basename 05/20/11 1116  WBC 14.1*  HGB 11.7  HCT 34.1*  PLT 165    BMET  Basename 05/20/11 1116  NA 143  K 4.9  CL 104  CO2 27  GLUCOSE 166*  BUN 32*  CREATININE 1.11*  CALCIUM 9.7      Medications: I have reviewed the patient's current medications.  Assessment/Plan: 1. Stage IIIA, grade 1 endometrial carcinoma, post robotic-assisted surgery, 19 February 2011, planned for 3 cycles of carboplatin with Taxotere followed by radiation:  she will be due cycle 3 of the carboplatin, Taxotere on November 26th with Neulasta November 27th. I have scheduled her back to my clinic Dec.4 with cbc and cmet, and will also set up new patient consultation with radiation oncology. 2. Type 2 diabetes on insulin:  Blood sugars were elevated as expected with steroids required for the Taxotere but seemed to have been managed adequately with additional sliding scale coverage. We will check CBG in infusion with treatment. 3. Pre-existing significant peripheral neuropathy, particularly in her feet, which may be related to diabetes, such that Taxol was contraindicated, and we have begun treatment with Taxotere plus carboplatin. 4. Long history of hypertension.  5. Previous peptic ulcer disease related to nonsteroidals. 6. History of gout, not presently bothersome.  LIVESAY,LENNIS P, MD   05/20/2011, 2:56 PM

## 2011-05-21 ENCOUNTER — Telehealth: Payer: Self-pay

## 2011-05-21 NOTE — Telephone Encounter (Signed)
TOLD MS. Barcenas  THAT DR. Darrold Span HAD TO ADJUST HER SCHEDULE, BECAUSE SHE REALIZED AFTER HER VISIT ON 05-15-11 THAT THE PLAN IS FOR RADIATION THERAPY AFTER THE UP COMING #3 CHEMO ON 05-27-11, THEN TO COMPLETE THE LAST 3 CYCLES OF CHEMO AFTER THE RADIATION FINISHED. DR. Darrold Span HAS ASKED THE SCHEDULERS TO CANCEL THE PLANNED CHEMO ON 12-17 AND NEULASTA ON 12-18. SHE IS TO KEEP VISIT WITH DR. Darrold Span ON 06-04-11 AS SCHEDULED. THE SCHEDULERS WILL ALSO SET UP AN APPT. WITH DR. KINARD WITH RADIATION ONCOLOGY AS SHE NEEDS TO MEET HIM AND DISCUSS TREATMENT PLANS AS RAADIATION WILL PROBABLY START ~ WEEK OF 06-17-11. REQUESTED THAT MS. Simerly  LET DR. Precious Reel NURSE KNOW IF SHE HAS NOT HEARD FROM THE SCHEDULERS FOR AN APPT. WITH DR. KINARD BY 05-27-11.  PT. VERBALIZED UNDERSTANDING.

## 2011-05-22 ENCOUNTER — Telehealth: Payer: Self-pay | Admitting: Oncology

## 2011-05-22 NOTE — Telephone Encounter (Signed)
Talked to gave her appt with Dr Roselind Messier in December

## 2011-05-24 ENCOUNTER — Other Ambulatory Visit: Payer: Self-pay | Admitting: Oncology

## 2011-05-27 ENCOUNTER — Other Ambulatory Visit (HOSPITAL_BASED_OUTPATIENT_CLINIC_OR_DEPARTMENT_OTHER): Payer: Self-pay

## 2011-05-27 ENCOUNTER — Ambulatory Visit (HOSPITAL_BASED_OUTPATIENT_CLINIC_OR_DEPARTMENT_OTHER): Payer: Self-pay

## 2011-05-27 ENCOUNTER — Other Ambulatory Visit: Payer: Self-pay | Admitting: Oncology

## 2011-05-27 DIAGNOSIS — C549 Malignant neoplasm of corpus uteri, unspecified: Secondary | ICD-10-CM

## 2011-05-27 DIAGNOSIS — C541 Malignant neoplasm of endometrium: Secondary | ICD-10-CM

## 2011-05-27 DIAGNOSIS — Z5111 Encounter for antineoplastic chemotherapy: Secondary | ICD-10-CM

## 2011-05-27 LAB — CBC WITH DIFFERENTIAL/PLATELET
BASO%: 0.1 % (ref 0.0–2.0)
Basophils Absolute: 0 10*3/uL (ref 0.0–0.1)
EOS%: 0 % (ref 0.0–7.0)
Eosinophils Absolute: 0 10*3/uL (ref 0.0–0.5)
HCT: 34 % — ABNORMAL LOW (ref 34.8–46.6)
HGB: 11.5 g/dL — ABNORMAL LOW (ref 11.6–15.9)
LYMPH%: 7 % — ABNORMAL LOW (ref 14.0–49.7)
MCH: 30.9 pg (ref 25.1–34.0)
MCHC: 33.8 g/dL (ref 31.5–36.0)
MCV: 91.4 fL (ref 79.5–101.0)
MONO#: 0.4 10*3/uL (ref 0.1–0.9)
MONO%: 2.1 % (ref 0.0–14.0)
NEUT#: 16.3 10*3/uL — ABNORMAL HIGH (ref 1.5–6.5)
NEUT%: 90.8 % — ABNORMAL HIGH (ref 38.4–76.8)
Platelets: 299 10*3/uL (ref 145–400)
RBC: 3.72 10*6/uL (ref 3.70–5.45)
RDW: 17 % — ABNORMAL HIGH (ref 11.2–14.5)
WBC: 18 10*3/uL — ABNORMAL HIGH (ref 3.9–10.3)
lymph#: 1.3 10*3/uL (ref 0.9–3.3)

## 2011-05-27 LAB — WHOLE BLOOD GLUCOSE
Glucose: 459 mg/dL — ABNORMAL HIGH (ref 70–100)
HRS PC: 1.5 Hours

## 2011-05-27 MED ORDER — INSULIN REGULAR HUMAN 100 UNIT/ML IJ SOLN
2.0000 [IU] | Freq: Once | INTRAMUSCULAR | Status: AC
  Administered 2011-05-27: 2 [IU] via SUBCUTANEOUS
  Filled 2011-05-27: qty 0.02

## 2011-05-27 MED ORDER — DEXAMETHASONE SODIUM PHOSPHATE 4 MG/ML IJ SOLN
10.0000 mg | Freq: Once | INTRAMUSCULAR | Status: AC
  Administered 2011-05-27: 10 mg via INTRAVENOUS

## 2011-05-27 MED ORDER — SODIUM CHLORIDE 0.9 % IV SOLN
Freq: Once | INTRAVENOUS | Status: AC
  Administered 2011-05-27: 10:00:00 via INTRAVENOUS

## 2011-05-27 MED ORDER — ONDANSETRON 16 MG/50ML IVPB (CHCC)
16.0000 mg | Freq: Once | INTRAVENOUS | Status: AC
  Administered 2011-05-27: 16 mg via INTRAVENOUS

## 2011-05-27 MED ORDER — SODIUM CHLORIDE 0.9 % IV SOLN
350.7000 mg | Freq: Once | INTRAVENOUS | Status: AC
  Administered 2011-05-27: 350 mg via INTRAVENOUS
  Filled 2011-05-27: qty 35

## 2011-05-27 MED ORDER — SODIUM CHLORIDE 0.9 % IV SOLN
53.0000 mg/m2 | Freq: Once | INTRAVENOUS | Status: AC
  Administered 2011-05-27: 120 mg via INTRAVENOUS
  Filled 2011-05-27: qty 12

## 2011-05-28 ENCOUNTER — Ambulatory Visit (HOSPITAL_BASED_OUTPATIENT_CLINIC_OR_DEPARTMENT_OTHER): Payer: Self-pay

## 2011-05-28 VITALS — BP 126/76 | HR 81 | Temp 97.5°F

## 2011-05-28 DIAGNOSIS — C549 Malignant neoplasm of corpus uteri, unspecified: Secondary | ICD-10-CM

## 2011-05-28 DIAGNOSIS — C541 Malignant neoplasm of endometrium: Secondary | ICD-10-CM

## 2011-05-28 MED ORDER — PEGFILGRASTIM INJECTION 6 MG/0.6ML
6.0000 mg | Freq: Once | SUBCUTANEOUS | Status: AC
  Administered 2011-05-28: 6 mg via SUBCUTANEOUS
  Filled 2011-05-28: qty 0.6

## 2011-05-31 ENCOUNTER — Encounter: Payer: Self-pay | Admitting: *Deleted

## 2011-05-31 DIAGNOSIS — Z9221 Personal history of antineoplastic chemotherapy: Secondary | ICD-10-CM | POA: Insufficient documentation

## 2011-05-31 DIAGNOSIS — M1990 Unspecified osteoarthritis, unspecified site: Secondary | ICD-10-CM | POA: Insufficient documentation

## 2011-05-31 DIAGNOSIS — G629 Polyneuropathy, unspecified: Secondary | ICD-10-CM | POA: Insufficient documentation

## 2011-05-31 DIAGNOSIS — M109 Gout, unspecified: Secondary | ICD-10-CM | POA: Insufficient documentation

## 2011-05-31 DIAGNOSIS — Z87440 Personal history of urinary (tract) infections: Secondary | ICD-10-CM | POA: Insufficient documentation

## 2011-05-31 NOTE — Progress Notes (Signed)
02/19/2011: Uterus/tubes/ovaries bxs = fallopian metastatic Endometrial Adenoncarcinoma,invasive grade 1 endometrioid adenocarcinoma,r ovary positive metastatic,Stage III a ,grade 1      Married,1 daughter, Dental assistant,,maternal grandmother ovarian ca,1 grandfather prostate ca,other grandparents cardiac disease     Allergies;nkda, adhesive tape allergy

## 2011-06-03 ENCOUNTER — Encounter: Payer: Self-pay | Admitting: Radiation Oncology

## 2011-06-03 ENCOUNTER — Ambulatory Visit
Admission: RE | Admit: 2011-06-03 | Discharge: 2011-06-03 | Disposition: A | Discharge status: Home / Self Care | Payer: Self-pay | Source: Ambulatory Visit | Attending: Radiation Oncology | Admitting: Radiation Oncology

## 2011-06-03 DIAGNOSIS — Z8042 Family history of malignant neoplasm of prostate: Secondary | ICD-10-CM | POA: Insufficient documentation

## 2011-06-03 DIAGNOSIS — E119 Type 2 diabetes mellitus without complications: Secondary | ICD-10-CM | POA: Insufficient documentation

## 2011-06-03 DIAGNOSIS — Z794 Long term (current) use of insulin: Secondary | ICD-10-CM | POA: Insufficient documentation

## 2011-06-03 DIAGNOSIS — C7982 Secondary malignant neoplasm of genital organs: Secondary | ICD-10-CM | POA: Insufficient documentation

## 2011-06-03 DIAGNOSIS — Y842 Radiological procedure and radiotherapy as the cause of abnormal reaction of the patient, or of later complication, without mention of misadventure at the time of the procedure: Secondary | ICD-10-CM | POA: Insufficient documentation

## 2011-06-03 DIAGNOSIS — Z510 Encounter for antineoplastic radiation therapy: Secondary | ICD-10-CM | POA: Insufficient documentation

## 2011-06-03 DIAGNOSIS — C541 Malignant neoplasm of endometrium: Secondary | ICD-10-CM | POA: Clinically undetermined

## 2011-06-03 DIAGNOSIS — C549 Malignant neoplasm of corpus uteri, unspecified: Secondary | ICD-10-CM | POA: Insufficient documentation

## 2011-06-03 DIAGNOSIS — Z9079 Acquired absence of other genital organ(s): Secondary | ICD-10-CM | POA: Insufficient documentation

## 2011-06-03 DIAGNOSIS — Z90710 Acquired absence of both cervix and uterus: Secondary | ICD-10-CM | POA: Insufficient documentation

## 2011-06-03 DIAGNOSIS — R197 Diarrhea, unspecified: Secondary | ICD-10-CM | POA: Insufficient documentation

## 2011-06-03 DIAGNOSIS — Z79899 Other long term (current) drug therapy: Secondary | ICD-10-CM | POA: Insufficient documentation

## 2011-06-03 DIAGNOSIS — Z8041 Family history of malignant neoplasm of ovary: Secondary | ICD-10-CM | POA: Insufficient documentation

## 2011-06-03 DIAGNOSIS — R110 Nausea: Secondary | ICD-10-CM | POA: Insufficient documentation

## 2011-06-03 DIAGNOSIS — R300 Dysuria: Secondary | ICD-10-CM | POA: Insufficient documentation

## 2011-06-03 DIAGNOSIS — G609 Hereditary and idiopathic neuropathy, unspecified: Secondary | ICD-10-CM | POA: Insufficient documentation

## 2011-06-03 DIAGNOSIS — I10 Essential (primary) hypertension: Secondary | ICD-10-CM | POA: Insufficient documentation

## 2011-06-03 DIAGNOSIS — C7960 Secondary malignant neoplasm of unspecified ovary: Secondary | ICD-10-CM | POA: Insufficient documentation

## 2011-06-03 DIAGNOSIS — M109 Gout, unspecified: Secondary | ICD-10-CM | POA: Insufficient documentation

## 2011-06-03 NOTE — Progress Notes (Signed)
Please see the Nurse Progress Note in the MD Initial Consult Encounter for this patient. 

## 2011-06-03 NOTE — Progress Notes (Signed)
Pt [employer]a lasst[facility] [facility] [employer], wears radiation badge, works part time., no c/o pain, no bleeding at present, mother living age 58,  2:48 PM

## 2011-06-04 ENCOUNTER — Other Ambulatory Visit (HOSPITAL_BASED_OUTPATIENT_CLINIC_OR_DEPARTMENT_OTHER): Payer: Self-pay

## 2011-06-04 ENCOUNTER — Other Ambulatory Visit: Payer: Self-pay | Admitting: Oncology

## 2011-06-04 ENCOUNTER — Telehealth: Payer: Self-pay | Admitting: Oncology

## 2011-06-04 ENCOUNTER — Ambulatory Visit (HOSPITAL_BASED_OUTPATIENT_CLINIC_OR_DEPARTMENT_OTHER): Payer: Self-pay | Admitting: Oncology

## 2011-06-04 VITALS — BP 118/65 | HR 82 | Temp 97.9°F | Ht 66.5 in | Wt 247.0 lb

## 2011-06-04 DIAGNOSIS — C541 Malignant neoplasm of endometrium: Secondary | ICD-10-CM

## 2011-06-04 DIAGNOSIS — G609 Hereditary and idiopathic neuropathy, unspecified: Secondary | ICD-10-CM

## 2011-06-04 DIAGNOSIS — E119 Type 2 diabetes mellitus without complications: Secondary | ICD-10-CM

## 2011-06-04 DIAGNOSIS — E669 Obesity, unspecified: Secondary | ICD-10-CM

## 2011-06-04 DIAGNOSIS — C549 Malignant neoplasm of corpus uteri, unspecified: Secondary | ICD-10-CM

## 2011-06-04 LAB — CBC WITH DIFFERENTIAL/PLATELET
BASO%: 0.9 % (ref 0.0–2.0)
Basophils Absolute: 0.3 10*3/uL — ABNORMAL HIGH (ref 0.0–0.1)
Eosinophils Absolute: 0.1 10*3/uL (ref 0.0–0.5)
HCT: 34.5 % — ABNORMAL LOW (ref 34.8–46.6)
HGB: 11.5 g/dL — ABNORMAL LOW (ref 11.6–15.9)
MCHC: 33.4 g/dL (ref 31.5–36.0)
MONO#: 0.7 10*3/uL (ref 0.1–0.9)
NEUT#: 23.3 10*3/uL — ABNORMAL HIGH (ref 1.5–6.5)
NEUT%: 87.8 % — ABNORMAL HIGH (ref 38.4–76.8)
WBC: 26.6 10*3/uL — ABNORMAL HIGH (ref 3.9–10.3)
lymph#: 2.3 10*3/uL (ref 0.9–3.3)

## 2011-06-04 LAB — COMPREHENSIVE METABOLIC PANEL
ALT: 22 U/L (ref 0–35)
AST: 23 U/L (ref 0–37)
Alkaline Phosphatase: 184 U/L — ABNORMAL HIGH (ref 39–117)
CO2: 25 mEq/L (ref 19–32)
Creatinine, Ser: 1.24 mg/dL — ABNORMAL HIGH (ref 0.50–1.10)
Sodium: 139 mEq/L (ref 135–145)
Total Bilirubin: 0.5 mg/dL (ref 0.3–1.2)
Total Protein: 6.6 g/dL (ref 6.0–8.3)

## 2011-06-04 NOTE — Progress Notes (Signed)
CC:   Reece Packer, M.D. Laurette Schimke, MD Elby Showers, MD Tonita Cong, M.D. Miguel Aschoff, M.D.  REFERRING PHYSICIAN:  Reece Packer, M.D.  DIAGNOSIS:  Stage IIIA, grade 1 endometrial carcinoma.  HISTORY OF PRESENT ILLNESS:  Lisa Morales is a very pleasant 60 year old female who is seen out the courtesy of Dr. Darrold Span for an opinion concerning radiation therapy as part of the management of the patient's endometrial cancer.  Ms. Silveira presented with postmenopausal vaginal bleeding this summer.  She subsequently  pursued evaluation with Dr. Miguel Aschoff.  Endometrial biopsy on January 15, 2011 showed a FIGO grade 1 endometrial adenocarcinoma with squamous differentiation.  In light of this, the patient was seen by Dr. Nelly Rout.  On August 21st, the patient was taken to the operating room at which time she underwent a robotic- assisted laparoscopic hysterectomy, bilateral salpingo-oophorectomy and right pelvic lymph node sampling.  Upon pathologic review, the patient was found have a grade 1 invasive endometrioid adenocarcinoma extending over 4.5 cm.  The tumor did invade into the outer 1/2 of the myometrium to a depth of 3.8 cm and a myometrial thickness of 4.1 cm.  The tumor in addition did show lymphovascular space invasion.  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  In addition, there was involvement of the right fallopian tube.  A lymph node from the right pelvis area (right external iliac region) showed no evidence of metastatic spread.  Pathologic staging was pT3a pN0 (TNM).  FIGO staging was IIIA.  Postoperatively, the patient did well.  She was subsequently seen by Dr. Darrold Span and treatment plan was for the patient to proceed with 3 cycles of adjuvant chemotherapy followed by radiation treatments which would then be followed by 3  additional cycles of chemotherapy.  The patient has recently completed her 3rd cycle of chemotherapy.  She seems to have tolerated her treatment quite well at this time.  The patient is now seen for evaluation as it relates to her malignancy.  ALLERGIES:  The patient has allergy to aspirin and naproxen as well as adhesive tape.  MEDICATIONS:  Regular Humulin insulin, Zyloprim, atenolol,capsicum.  The patient is also on Neurontin, hydrochlorothiazide, insulin NPH, insulin regular, Claritin, Zofran and Compazine.  Doses and scheduling are reviewed in the electronic medical record.  MEDICAL HISTORY:  Significant for hypertension for approximately 20 years, history of gout, peripheral neuropathy, insulin-dependent diabetes mellitus.  The patient has been on insulin since 2010.  The patient has peripheral neuropathy, history of arthritis and history of peptic ulcer disease with hemorrhage.  PRIOR SURGERIES:  Include laparoscopic-assisted hysterectomy as above.  SOCIAL HISTORY:  The patient is married and lives in the [place] area.  There is no history of tobacco use.  The patient has occasional alcohol intake.  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  FAMILY HISTORY:  Significant for paternal grandmother with history of ovarian cancer and paternal grandfather with history of prostate cancer. There is no history of endometrial cancer or breast cancer.  REVIEW OF SYSTEMS:  As above.  The patient presented with postmenopausal vaginal bleeding.  She denied any pain in the pelvis or low back area at the time of diagnosis.  The patient's vaginal bleeding subsequently subsided with her surgery.  The patient overall has tolerated her chemotherapy quite well.  She denies any significant problems with nausea  or diarrhea during her chemotherapy administration.  Complete review of systems is undertaken with the patient and other than the above-mentioned  issues is unremarkable.  PHYSICAL EXAMINATION:  General:  This is a very pleasant, upbeat, 25- year-old female in no acute distress.  Vital signs:  Temperature is 97.7, pulse 85, blood pressure is elevated at 147/97.  I have recommended the patient follow up with her primary care physician concerning this issue.  HEENT:  Examination of the pupils reveals them to be equal, round and reactive to light.  The extraocular eye movements are intact.  The tongue is midline.  There is no secondary infection noted in the oral cavity or posterior pharynx.  Neck:  Examination of the neck and supraclavicular region reveals no evidence of adenopathy. The axillary areas are free of adenopathy.  Lungs:  Examination of the lungs reveals them to be clear.  Heart:  The heart has a regular rhythm and rate.  Abdomen:  Examination of the abdomen reveals it to be soft and nontender with normal bowel sounds.  There are small scars (5) from the patient's prior laparoscopic-assisted hysterectomy.  The inguinal areas are free of adenopathy.  Pelvic:  The external genitalia are unremarkable.  A speculum exam was performed.  The vaginal cuff is well- healed without signs of recurrence.  On bimanual examination, there are no pelvic masses appreciated.  Neurologic:  Motor strength is 5/5 in the proximal and distal muscle groups of the upper and lower extremities. Peripheral pulses are good.  The patient had some mild edema in the ankle and foot areas.  LABORATORY DATA:  From in November 26, white count 18.0, hemoglobin 11.5, hematocrit 34.0, platelet count 299,000, glucose elevated at 459.  X-RAY STUDIES:  Preoperative chest x-ray on August 14 showing no evidence of pulmonary metastasis or acute process within the lung.  IMPRESSION AND PLAN:  Stage IIIA endometrial adenocarcinoma.  The patient is at significant risk for recurrence within the pelvis as well as systemic recurrence.  I would agree with recommendations  for combined modality therapy in this patient.  The patient's radiation therapy recommendation would consist of external beam radiation therapy directed at the pelvis region followed by intracavitary brachytherapy treatments given the deeply invasive tumor as well as evidence of lymphovascular space invasion.  I discussed the overall treatment course, side effects and potential toxicities of radiation therapy in this situation with Ms. Odell.  The patient appears to understand and wishes to proceed with planned course of treatment.  The patient will receive approximately 25 treatments of external beam radiation therapy directed at the pelvis area.  This will then be followed by 3 intracavitary brachytherapy treatments directed at the proximal vagina.  After this aspect of the patient's treatment is complete, she will then proceed with 3 additional cycles of adjuvant chemotherapy.    ______________________________ Billie Lade, Ph.D., M.D. JDK/MEDQ  D:  06/03/2011  T:  06/04/2011  Job:  [id number]

## 2011-06-04 NOTE — Telephone Encounter (Signed)
gve the pt her jan 2013 appt calendar °

## 2011-06-08 NOTE — Progress Notes (Signed)
OFFICE PROGRESS NOTE INTERVAL HISTORY:   Date of Visit Jun 04, 2011 Physicians: W.Brewster, C.Sherryll Burger, J.Kerr  Patient is seen, alone for visit today, in continuing attention to her IIIA grade1 endometrial carcinoma, having had surgery by Dr.Brewster on 02-19-11 and third cycle of taxotere/carboplatin on 11-26 with neulasta 05-28-11. She is scheduled for simulation by Dr.Kinard on 12-10, with plan for 3 additional cycles of chemotherapy after RT.   Mrs. Treu again tolerated chemotherapy well, with no nausea at all and covering blood sugars with sliding scale insulin at home during the 3 days of decadron around each taxotere. She has had no fever or symptoms of infection, no bleeding, no worsening of baseline peripheral neuropathy, blood sugars back to usual now, no increased SOB, no new or different pain, bowels ok, bladder ok, able to work usual schedule (Wed, Thurs, Fri).  She was pleased with her consultation with Dr.Kinard. Review of Systems: otherwise full 10 point ROS negative.  Objective: Alert, very pleasant, mobile without assistance, NAD. Vital signs in last 24 hours:  BP 118/65  Pulse 82  Temp(Src) 97.9 F (36.6 C) (Oral)  Ht 5' 6.5" (1.689 m)  Wt 247 lb (112.038 kg)  BMI 39.27 kg/m2    HEENT:mucous membranes moist, pharynx normal without lesions PERRL. Hair thin but no complete alopecia LymphaticsCervical, supraclavicular, and axillary nodes normal. No inguinal adenopathy Resp: clear to auscultation bilaterally and normal percussion bilaterally Cardio: regular rate and rhythm GI: soft, non-tender; bowel sounds normal; no masses,  no organomegaly. Obese. Extremities: extremities normal, atraumatic, no cyanosis or edema Skin not remarkable   Lab Results: Blood sugar day of chemo 05-27-11 initially 459 (decadron 8mg  bid x 3d beginning day prior to rx) which was covered with SS insulin also during rx. CBC   WBC 26.6 post neulasta with ANC 23.3,  Hgb 11.5, plt  285k  BMET/CMET : glucose 168, BUN 34, creat 1.24 (up from 32 and 1.1 prior), AP 184, otherwise normal.  Studies/Results:  No results found.  Medications: I have reviewed the patient's current medications.  Assessment/Plan:  1. IIIA gr1 endometrial cancer, post treatment as above and now for RT prior to last 3 chemotherapy cycles. Dates for RT other than simulation are still pending, so that I have tentatively scheduled her back to my clinic on Jan 14 with CBC/cmet then. 2. Diabetes on insulin, followed by Dr.Kerr.  3.Baseline peripheral neuropathy likely related to diabetes, such that taxotere is being used in preference to taxol. 4. Obesity 5. Hypertension, on medication  Patient was comfortable with plan as above.       Taneesha Edgin P, MD   06/08/2011, 7:08 PM

## 2011-06-10 ENCOUNTER — Ambulatory Visit
Admission: RE | Admit: 2011-06-10 | Discharge: 2011-06-10 | Disposition: A | Discharge status: Home / Self Care | Payer: Self-pay | Source: Ambulatory Visit | Attending: Radiation Oncology | Admitting: Radiation Oncology

## 2011-06-10 ENCOUNTER — Ambulatory Visit: Admission: RE | Admit: 2011-06-10 | Payer: Self-pay | Source: Ambulatory Visit

## 2011-06-11 NOTE — Progress Notes (Signed)
DIAGNOSIS:  Endometrial cancer.  NARRATIVE:  Earlier today, Mrs. Lisa Morales underwent treatment planning to begin radiation therapy directed at the pelvis area.  The patient was placed on the CT simulator table.  A customized  Alpha Cradle (Aquaplast mold) was developed for positioning of the patient daily for treatment. The patient then had a vaginal swab soaked in contrast material placed in the proximal vagina.  The patient then proceeded to undergo CT scan in the treatment position.  Under virtual simulation, the vaginal swab, bladder, rectum, small-bowel, and femoral head and neck areas were outlined.  The patient then had setup of 4 custom-shaped fields encompassing the pelvis region.  A computerized isodose plan will be generated for treatment.  In addition, dose volume histograms of the target volume as well as the bladder, rectum, small-bowel, and femoral head and neck areas will be assessed.  TREATMENT PLAN:  The patient is to proceed with daily radiation treatments at 180 cGy per day for 25 treatments for a cumulative dose to the pelvis area of 4,500 cGy.  The patient be treated with 18 MV photons.  After completion of the patient's external beam treatment, she will then proceed with intracavitary brachytherapy treatments for 3 weekly sessions at 600 cGy per day for a cumulative brachytherapy dose of 1,800 cGy and a cumulative dose to the proximal vagina of 6,300 cGy.    ______________________________ Billie Lade, Ph.D., M.D. JDK/MEDQ  D:  06/10/2011  T:  06/11/2011  Job:  [id number]

## 2011-06-17 ENCOUNTER — Ambulatory Visit
Admission: RE | Admit: 2011-06-17 | Discharge: 2011-06-17 | Disposition: A | Discharge status: Home / Self Care | Payer: Self-pay | Source: Ambulatory Visit | Attending: Radiation Oncology | Admitting: Radiation Oncology

## 2011-06-17 ENCOUNTER — Other Ambulatory Visit: Payer: Self-pay | Admitting: Lab

## 2011-06-17 ENCOUNTER — Ambulatory Visit: Payer: Self-pay

## 2011-06-17 NOTE — Procedures (Signed)
DIAGNOSIS:  Endometrial cancer.  NARRATIVE:  Earlier today Mrs. Lisa Morales underwent film verification of her setup directed at the pelvis area.  The patient's isocenter was accurately placed, and the multileaf collimators contoured the target volume accurately on all fields imaged.    ______________________________ Billie Lade, Ph.D., M.D. JDK/MEDQ  D:  06/17/2011  T:  06/17/2011  Job:  [id number]

## 2011-06-18 ENCOUNTER — Ambulatory Visit: Payer: Self-pay

## 2011-06-18 ENCOUNTER — Ambulatory Visit
Admission: RE | Admit: 2011-06-18 | Discharge: 2011-06-18 | Disposition: A | Discharge status: Home / Self Care | Payer: Self-pay | Source: Ambulatory Visit | Attending: Radiation Oncology | Admitting: Radiation Oncology

## 2011-06-18 VITALS — Wt 250.0 lb

## 2011-06-18 DIAGNOSIS — C541 Malignant neoplasm of endometrium: Secondary | ICD-10-CM

## 2011-06-18 NOTE — Progress Notes (Signed)
CC:   Laurette Schimke, MD  DIAGNOSIS:  Stage IIIA endometrial cancer.  NARRATIVE:  Mrs. Diclemente is seen today for weekly assessment.  She completed her 1st radiation therapy directed at the pelvis (180 cGy of a planned 4500 cGy external beam radiation treatments).  The patient is tolerating her treatments well at this time without any side effects. The patient is accompanied by her daughter at this time.  PHYSICAL EXAMINATION:  Vital Signs:  The patient's weight is 250 pounds. Lungs:  Lungs are clear.  Heart:  The heart has a regular rhythm and rate.  Abdomen:  Soft and nontender with normal bowel sounds.  IMPRESSION AND PLAN:  The patient is tolerating her radiation treatments well thus far.  The patient's radiation fields are setting up accurately.  The patient's radiation chart was checked today.  The plan is to continue with external beam followed by intracavitary brachytherapy treatments.    ______________________________ Billie Lade, Ph.D., M.D. JDK/MEDQ  D:  06/18/2011  T:  06/18/2011  Job:  [id number]

## 2011-06-18 NOTE — Progress Notes (Signed)
1st ,tx, no c/o   Except diabetic neuoropathy in feet, , will post sim tomorrow pt agreeable, no other c/o 4:26 PM

## 2011-06-19 ENCOUNTER — Ambulatory Visit
Admission: RE | Admit: 2011-06-19 | Discharge: 2011-06-19 | Disposition: A | Discharge status: Home / Self Care | Payer: Self-pay | Source: Ambulatory Visit | Attending: Radiation Oncology | Admitting: Radiation Oncology

## 2011-06-19 DIAGNOSIS — C541 Malignant neoplasm of endometrium: Secondary | ICD-10-CM

## 2011-06-19 NOTE — Progress Notes (Signed)
Post sim teaching, Radiation therapy and You booklet, sitz bath givn to pt with instructions of use, of potential s/e, s/s to report, will see MD weekly /prn, calendar of pt schedule  also given to pt, pt gave verbal understanding 4:26 PM

## 2011-06-20 ENCOUNTER — Ambulatory Visit
Admission: RE | Admit: 2011-06-20 | Discharge: 2011-06-20 | Disposition: A | Discharge status: Home / Self Care | Payer: Self-pay | Source: Ambulatory Visit | Attending: Radiation Oncology | Admitting: Radiation Oncology

## 2011-06-21 ENCOUNTER — Ambulatory Visit
Admission: RE | Admit: 2011-06-21 | Discharge: 2011-06-21 | Disposition: A | Discharge status: Home / Self Care | Payer: Self-pay | Source: Ambulatory Visit | Attending: Radiation Oncology | Admitting: Radiation Oncology

## 2011-06-24 ENCOUNTER — Ambulatory Visit
Admission: RE | Admit: 2011-06-24 | Discharge: 2011-06-24 | Disposition: A | Discharge status: Home / Self Care | Payer: Self-pay | Source: Ambulatory Visit | Attending: Radiation Oncology | Admitting: Radiation Oncology

## 2011-06-26 ENCOUNTER — Ambulatory Visit
Admission: RE | Admit: 2011-06-26 | Discharge: 2011-06-26 | Disposition: A | Discharge status: Home / Self Care | Payer: Self-pay | Source: Ambulatory Visit | Attending: Radiation Oncology | Admitting: Radiation Oncology

## 2011-06-26 DIAGNOSIS — C541 Malignant neoplasm of endometrium: Secondary | ICD-10-CM

## 2011-06-26 NOTE — Progress Notes (Signed)
Weekly Management Note Current Dose: 10.8 Gy  Projected Dose:63 Gy   Narrative:  The patient presents for routine under treatment assessment.  CBCT/MVCT images/Port film x-rays were reviewed.  The chart was checked. 3-4 BM today. Using baby wipes. Using baby powder in am and desitin om. Working.  Physical Findings:  Alert, awake. No distress.  Vitals: There were no vitals filed for this visit. Weight:  Wt Readings from Last 3 Encounters:  06/26/11 255 lb 6.4 oz (115.849 kg)  06/18/11 250 lb (113.399 kg)  06/04/11 247 lb (112.038 kg)   Lab Results  Component Value Date   WBC 26.6* 06/04/2011   HGB 11.5* 06/04/2011   HCT 34.5* 06/04/2011   MCV 94.1 06/04/2011   PLT 285 06/04/2011   Lab Results  Component Value Date   CREATININE 1.24* 06/04/2011   CREATININE 1.24* 06/04/2011   BUN 34* 06/04/2011   BUN 34* 06/04/2011   NA 139 06/04/2011   NA 139 06/04/2011   K 4.4 06/04/2011   K 4.4 06/04/2011   CL 103 06/04/2011   CL 103 06/04/2011   CO2 25 06/04/2011   CO2 25 06/04/2011     Impression:  The patient is tolerating radiation.  Plan:  Continue treatment as planned. Counseled patient not to use baby powder or desitin before tx. Use immodium for >3 stools per day.

## 2011-06-26 NOTE — Progress Notes (Signed)
Pt uses depends daily,  4:30 PM

## 2011-06-26 NOTE — Progress Notes (Signed)
Rash  B/l buttocks, pt using baby powder and desitin cream  In am only, no soreness or pain, just itching,  Eating well, drinking enough fluids stated, mostly water 4:28 PM

## 2011-06-27 ENCOUNTER — Ambulatory Visit
Admission: RE | Admit: 2011-06-27 | Discharge: 2011-06-27 | Disposition: A | Discharge status: Home / Self Care | Payer: Self-pay | Source: Ambulatory Visit | Attending: Radiation Oncology | Admitting: Radiation Oncology

## 2011-06-28 ENCOUNTER — Ambulatory Visit
Admission: RE | Admit: 2011-06-28 | Discharge: 2011-06-28 | Disposition: A | Discharge status: Home / Self Care | Payer: Self-pay | Source: Ambulatory Visit | Attending: Radiation Oncology | Admitting: Radiation Oncology

## 2011-07-01 ENCOUNTER — Ambulatory Visit
Admission: RE | Admit: 2011-07-01 | Discharge: 2011-07-01 | Disposition: A | Discharge status: Home / Self Care | Payer: Self-pay | Source: Ambulatory Visit | Attending: Radiation Oncology | Admitting: Radiation Oncology

## 2011-07-01 ENCOUNTER — Encounter: Payer: Self-pay | Admitting: Radiation Oncology

## 2011-07-01 VITALS — BP 130/84 | HR 78 | Temp 97.8°F | Resp 20 | Wt 249.3 lb

## 2011-07-01 DIAGNOSIS — C541 Malignant neoplasm of endometrium: Secondary | ICD-10-CM

## 2011-07-01 NOTE — Progress Notes (Signed)
Pt ambulating to room wearing a mask,stated" getting over this cold,", takes head decongestant otc  Prn, not using the baby powder and desiten till after rad tx , no c/o diarrhea, or loose stools, drinking plebnty water, tryingto lose weight, took orthostatic b/p loss 5 lbs in 1 week 9:07 AM

## 2011-07-03 ENCOUNTER — Ambulatory Visit
Admission: RE | Admit: 2011-07-03 | Discharge: 2011-07-03 | Disposition: A | Discharge status: Home / Self Care | Payer: Self-pay | Source: Ambulatory Visit | Attending: Radiation Oncology | Admitting: Radiation Oncology

## 2011-07-03 NOTE — Progress Notes (Signed)
DIAGNOSIS:  Stage IIIA endometrial cancer.  NARRATIVE:  Lisa Morales was seen today for weekly assessment.  She has completed 9/25 planned external beam radiation treatments (1,620 cGy of a planned 4,500 cGy).  The patient is tolerating her treatments well at this time.  She denies any significant problems such as nausea or diarrhea.  The patient denies any vaginal bleeding or urination difficulties.  The patient has been bothered with upper respiratory illness and is wearing a mask today in light of this issue.  PHYSICAL EXAMINATION:  VITAL SIGNS:  The patient is afebrile with a temperature of 97.8.  The patient's weight is 249, which is down approximately 6 pounds since her weighing last week.  The patient has actually been trying to lose some weight.  Blood pressure measurements were taken showing this to be stable.  Examination of the Lungs:  Clear. Heart:  Regular rhythm and rate.  Abdomen:  Soft and nontender with normal bowel sounds.  IMPRESSION AND PLAN:  The patient is tolerating her treatments well at this time.  The patient's radiation fields are setting up accurately. The patient's radiation chart was checked today.  The plan is to continue to a cumulative dose of 4,500 cGy followed by intracavitary brachytherapy treatments.    ______________________________ Billie Lade, Ph.D., M.D. JDK/MEDQ  D:  07/01/2011  T:  07/01/2011  Job:  [id number]

## 2011-07-04 ENCOUNTER — Ambulatory Visit
Admission: RE | Admit: 2011-07-04 | Discharge: 2011-07-04 | Disposition: A | Discharge status: Home / Self Care | Payer: Self-pay | Source: Ambulatory Visit | Attending: Radiation Oncology | Admitting: Radiation Oncology

## 2011-07-05 ENCOUNTER — Ambulatory Visit
Admission: RE | Admit: 2011-07-05 | Discharge: 2011-07-05 | Disposition: A | Discharge status: Home / Self Care | Payer: Self-pay | Source: Ambulatory Visit | Attending: Radiation Oncology | Admitting: Radiation Oncology

## 2011-07-08 ENCOUNTER — Ambulatory Visit
Admission: RE | Admit: 2011-07-08 | Discharge: 2011-07-08 | Disposition: A | Discharge status: Home / Self Care | Payer: Self-pay | Source: Ambulatory Visit | Attending: Radiation Oncology | Admitting: Radiation Oncology

## 2011-07-09 ENCOUNTER — Ambulatory Visit
Admission: RE | Admit: 2011-07-09 | Discharge: 2011-07-09 | Disposition: A | Discharge status: Home / Self Care | Payer: Self-pay | Source: Ambulatory Visit | Attending: Radiation Oncology | Admitting: Radiation Oncology

## 2011-07-09 DIAGNOSIS — C549 Malignant neoplasm of corpus uteri, unspecified: Secondary | ICD-10-CM | POA: Diagnosis present

## 2011-07-09 NOTE — Progress Notes (Signed)
Pt. Has completed 14/25 radiation treatments to pelvis for dx of endometrial ca. Occasional nausea resolved with zofran. Denies pain. Bowel movements soft x2 today.

## 2011-07-09 NOTE — Progress Notes (Signed)
[facility]    Radiation Oncology [address] [place]     Maryln Gottron, M.D. [place], [place] [address]               Billie Lade, M.D., Ph.D. Phone: [phone]      Molli Hazard A. Kathrynn Running, M.D. Fax: [phone]      Radene Gunning, M.D., Ph.D.         Lurline Hare, M.D.         Grayland Jack, M.D Weekly Treatment Management Note  Name: Lisa Morales     MRN: [id number]        CSN: [id number] Date: 07/09/2011      DOB: Aug 19, 1950  CC: Provider Not In System         [place]    Status: Outpatient  Diagnosis: The encounter diagnosis was Malignant neoplasm of body of uterus.  Current Dose: 2520  Current Fraction: 14  Planned Dose: 4500 cGy  Narrative: Lisa Morales was seen today for weekly treatment management. The chart was checked and port films  were reviewed. Pt. Slowly recovering from probable URI.  Some nausea-controlled with compazine/zofran, mild diarrhea-controlled with imodium. Mild dysuria  Adhesive; Aspirin; and Naproxen Current Outpatient Prescriptions  Medication Sig Dispense Refill  . allopurinol (ZYLOPRIM) 300 MG tablet Take 300 mg by mouth daily.        Marland Kitchen ascorbic acid (VITAMIN C) 1000 MG tablet Take 1,000 mg by mouth 2 (two) times daily.        Marland Kitchen atenolol (TENORMIN) 100 MG tablet Take 100 mg by mouth daily.        . B Complex Vitamins (VITAMIN B COMPLEX) TABS Take 1 tablet by mouth daily.        . capsicum (ZOSTRIX DIABETIC FOOT PAIN) 0.075 % topical cream Apply 1 application topically 3 (three) times daily.        Marland Kitchen dexamethasone (DECADRON) 4 MG tablet TAKE 2 TABS WITH FOOD  TWICE A DAY FOR 3 DAYS.  BEGIN DAY BEFORE TAXOTERE CHEMOTHERAPY.       . diphenhydramine-acetaminophen (TYLENOL PM) 25-500 MG TABS Take 1 tablet by mouth at bedtime as needed.        . fish oil-omega-3 fatty acids 1000 MG capsule Take 1 g by mouth daily.        Marland Kitchen gabapentin (NEURONTIN) 300 MG capsule Take 300 mg by mouth 3 (three) times daily.        Marland Kitchen GINSENG PO Take 1  tablet by mouth daily.        . hydrochlorothiazide (HYDRODIURIL) 12.5 MG tablet Take 12.5 mg by mouth daily.        . insulin NPH (HUMULIN N,NOVOLIN N) 100 UNIT/ML injection Inject 48 Units into the skin 2 (two) times daily. MIXES WITH REGULAR IN THE SAME SYRINGE.      Marland Kitchen insulin regular (HUMULIN R,NOVOLIN R) 100 units/mL injection Inject into the skin 3 (three) times daily before meals. SLIDING SCALE INSULIN FOR DAYS TAKING DECADRON                                    BS 200-250 TAKE 2 UNITS  BS >250 -300 TAKE 4 UNITS                                                                                     BS > 300-350 TAKE 6 UNITS                                                                                      BS > 350         TAKE 8 UNITS       . insulin regular (HUMULIN R,NOVOLIN R) 100 units/mL injection Inject 28 Units into the skin 2 (two) times daily before a meal. MIXES WITH HUMULIN NPH IN SAME SYRINGE.       Marland Kitchen loratadine (CLARITIN) 10 MG tablet Take 10 mg by mouth daily.        . Multiple Vitamin (MULTIVITAMIN PO) Take 1 tablet by mouth daily.        . ondansetron (ZOFRAN) 8 MG tablet Take 1-2 mg by mouth every 12 (twelve) hours as needed. WILL NOT MAKE YOU DROWSY       . prochlorperazine (COMPAZINE) 10 MG tablet Take 10 mg by mouth every 6 (six) hours as needed. FOR NAUSEA        Labs:  Lab Results  Component Value Date   WBC 26.6* 06/04/2011   HGB 11.5* 06/04/2011   HCT 34.5* 06/04/2011   MCV 94.1 06/04/2011   PLT 285 06/04/2011   Lab Results  Component Value Date   CREATININE 1.24* 06/04/2011   CREATININE 1.24* 06/04/2011   BUN 34* 06/04/2011   BUN 34* 06/04/2011   NA 139 06/04/2011   NA 139 06/04/2011   K 4.4 06/04/2011   K 4.4 06/04/2011   CL 103 06/04/2011   CL 103 06/04/2011   CO2 25 06/04/2011   CO2 25 06/04/2011   Lab Results  Component Value Date   ALT 22 06/04/2011   ALT 22 06/04/2011   AST 23  06/04/2011   AST 23 06/04/2011   BILITOT 0.5 06/04/2011   BILITOT 0.5 06/04/2011    Physical Examination:  weight is 243 lb 8 oz (110.451 kg). Her temperature is 97.6 F (36.4 C). Her blood pressure is 146/86 and her pulse is 79.    Wt Readings from Last 3 Encounters:  07/09/11 243 lb 8 oz (110.451 kg)  07/01/11 249 lb 4.8 oz (113.082 kg)  06/26/11 255 lb 6.4 oz (115.849 kg)     Lungs - Normal respiratory effort, chest expands symmetrically. Lungs are clear to auscultation, no crackles or wheezes.  Heart has regular rhythm and rate  Abdomen is soft and non tender with normal bowel sounds Pt wearing mask in light of URI  Assessment:  Patient tolerating treatments well except for issues above. Monitor dysuria, consider u/a c+s if more significant. Monitor weight loss.  Plan: Continue treatment per original radiation prescription

## 2011-07-10 ENCOUNTER — Ambulatory Visit
Admission: RE | Admit: 2011-07-10 | Discharge: 2011-07-10 | Disposition: A | Discharge status: Home / Self Care | Payer: Self-pay | Source: Ambulatory Visit | Attending: Radiation Oncology | Admitting: Radiation Oncology

## 2011-07-11 ENCOUNTER — Ambulatory Visit
Admission: RE | Admit: 2011-07-11 | Discharge: 2011-07-11 | Disposition: A | Discharge status: Home / Self Care | Payer: Self-pay | Source: Ambulatory Visit | Attending: Radiation Oncology | Admitting: Radiation Oncology

## 2011-07-12 ENCOUNTER — Ambulatory Visit
Admission: RE | Admit: 2011-07-12 | Discharge: 2011-07-12 | Disposition: A | Discharge status: Home / Self Care | Payer: Self-pay | Source: Ambulatory Visit | Attending: Radiation Oncology | Admitting: Radiation Oncology

## 2011-07-15 ENCOUNTER — Ambulatory Visit
Admission: RE | Admit: 2011-07-15 | Discharge: 2011-07-15 | Disposition: A | Discharge status: Home / Self Care | Payer: Self-pay | Source: Ambulatory Visit | Attending: Radiation Oncology | Admitting: Radiation Oncology

## 2011-07-16 ENCOUNTER — Ambulatory Visit (HOSPITAL_BASED_OUTPATIENT_CLINIC_OR_DEPARTMENT_OTHER): Payer: Self-pay | Admitting: Oncology

## 2011-07-16 ENCOUNTER — Ambulatory Visit
Admission: RE | Admit: 2011-07-16 | Discharge: 2011-07-16 | Disposition: A | Discharge status: Home / Self Care | Payer: Self-pay | Source: Ambulatory Visit | Attending: Radiation Oncology | Admitting: Radiation Oncology

## 2011-07-16 ENCOUNTER — Other Ambulatory Visit (HOSPITAL_BASED_OUTPATIENT_CLINIC_OR_DEPARTMENT_OTHER): Payer: Self-pay | Admitting: Lab

## 2011-07-16 ENCOUNTER — Encounter: Payer: Self-pay | Admitting: Radiation Oncology

## 2011-07-16 VITALS — BP 134/79 | HR 80 | Temp 98.0°F | Ht 66.5 in | Wt 246.0 lb

## 2011-07-16 DIAGNOSIS — C541 Malignant neoplasm of endometrium: Secondary | ICD-10-CM

## 2011-07-16 DIAGNOSIS — Z794 Long term (current) use of insulin: Secondary | ICD-10-CM

## 2011-07-16 DIAGNOSIS — E119 Type 2 diabetes mellitus without complications: Secondary | ICD-10-CM

## 2011-07-16 DIAGNOSIS — Z79899 Other long term (current) drug therapy: Secondary | ICD-10-CM

## 2011-07-16 DIAGNOSIS — C549 Malignant neoplasm of corpus uteri, unspecified: Secondary | ICD-10-CM

## 2011-07-16 LAB — COMPREHENSIVE METABOLIC PANEL
Alkaline Phosphatase: 78 U/L (ref 39–117)
BUN: 31 mg/dL — ABNORMAL HIGH (ref 6–23)
CO2: 27 mEq/L (ref 19–32)
Creatinine, Ser: 1.06 mg/dL (ref 0.50–1.10)
Glucose, Bld: 117 mg/dL — ABNORMAL HIGH (ref 70–99)
Total Bilirubin: 0.6 mg/dL (ref 0.3–1.2)

## 2011-07-16 LAB — CBC WITH DIFFERENTIAL/PLATELET
Basophils Absolute: 0 10*3/uL (ref 0.0–0.1)
Eosinophils Absolute: 0.2 10*3/uL (ref 0.0–0.5)
HGB: 11.3 g/dL — ABNORMAL LOW (ref 11.6–15.9)
LYMPH%: 10.2 % — ABNORMAL LOW (ref 14.0–49.7)
MCV: 98.6 fL (ref 79.5–101.0)
MONO#: 0.4 10*3/uL (ref 0.1–0.9)
MONO%: 6.7 % (ref 0.0–14.0)
NEUT#: 5 10*3/uL (ref 1.5–6.5)
Platelets: 299 10*3/uL (ref 145–400)
RBC: 3.32 10*6/uL — ABNORMAL LOW (ref 3.70–5.45)
WBC: 6.3 10*3/uL (ref 3.9–10.3)

## 2011-07-16 NOTE — Progress Notes (Signed)
Here for routine weekly radiation appointment. 19/25 treatments to pelvis doing well except acute productive mucus which is clear. Low grade temperature 99.4. States perineum w/o peeling.

## 2011-07-16 NOTE — Progress Notes (Signed)
[facility]    Radiation Oncology [address] [place]     Maryln Gottron, M.D. [place], [place] [address]               Billie Lade, M.D., Ph.D. Phone: [phone]      Molli Hazard A. Kathrynn Running, M.D. Fax: [phone]      Radene Gunning, M.D., Ph.D.         Lurline Hare, M.D.         Grayland Jack, M.D Weekly Treatment Management Note  Name: Lisa Morales     MRN: [id number]        CSN: [id number] Date: 07/16/2011      DOB: 12-01-1950  CC: Provider Not In System         [place]    Status: Outpatient  Diagnosis: The encounter diagnosis was Malignant neoplasm of body of uterus.  Current Dose: 3420  Current Fraction: 19  Planned Dose: 4500  Narrative: Freddy Jaksch was seen today for weekly treatment management. The chart was checked and port films  were reviewed. Pt having soft stools, mild diarrhea, mild intermittent dysuria. Some fatigue.  Adhesive; Aspirin; and Naproxen Current Outpatient Prescriptions  Medication Sig Dispense Refill  . allopurinol (ZYLOPRIM) 300 MG tablet Take 300 mg by mouth daily.        Marland Kitchen ascorbic acid (VITAMIN C) 1000 MG tablet Take 1,000 mg by mouth 2 (two) times daily.        Marland Kitchen atenolol (TENORMIN) 100 MG tablet Take 100 mg by mouth daily.        . B Complex Vitamins (VITAMIN B COMPLEX) TABS Take 1 tablet by mouth daily.        . capsicum (ZOSTRIX DIABETIC FOOT PAIN) 0.075 % topical cream Apply 1 application topically 3 (three) times daily.        Marland Kitchen dexamethasone (DECADRON) 4 MG tablet TAKE 2 TABS WITH FOOD  TWICE A DAY FOR 3 DAYS.  BEGIN DAY BEFORE TAXOTERE CHEMOTHERAPY.       . diphenhydramine-acetaminophen (TYLENOL PM) 25-500 MG TABS Take 1 tablet by mouth at bedtime as needed.        . diphenoxylate-atropine (LOMOTIL) 2.5-0.025 MG per tablet Take 1 tablet by mouth 4 (four) times daily as needed. Takes 2 to start after onset of loose stool then 1 tab after loose stool      . fish oil-omega-3 fatty acids 1000 MG capsule Take 1 g by mouth  daily.        Marland Kitchen gabapentin (NEURONTIN) 300 MG capsule Take 300 mg by mouth 3 (three) times daily.        Marland Kitchen GINSENG PO Take 1 tablet by mouth daily.        . hydrochlorothiazide (HYDRODIURIL) 12.5 MG tablet Take 12.5 mg by mouth daily.        . insulin NPH (HUMULIN N,NOVOLIN N) 100 UNIT/ML injection Inject 48 Units into the skin 2 (two) times daily. MIXES WITH REGULAR IN THE SAME SYRINGE.      Marland Kitchen insulin regular (HUMULIN R,NOVOLIN R) 100 units/mL injection Inject into the skin 3 (three) times daily before meals. SLIDING SCALE INSULIN FOR DAYS TAKING DECADRON                                    BS 200-250 TAKE 2 UNITS  BS >250 -300 TAKE 4 UNITS                                                                                     BS > 300-350 TAKE 6 UNITS                                                                                      BS > 350         TAKE 8 UNITS       . insulin regular (HUMULIN R,NOVOLIN R) 100 units/mL injection Inject 28 Units into the skin 2 (two) times daily before a meal. MIXES WITH HUMULIN NPH IN SAME SYRINGE.       Marland Kitchen loratadine (CLARITIN) 10 MG tablet Take 10 mg by mouth daily.        . Multiple Vitamin (MULTIVITAMIN PO) Take 1 tablet by mouth daily.        . ondansetron (ZOFRAN) 8 MG tablet Take 1-2 mg by mouth every 12 (twelve) hours as needed. WILL NOT MAKE YOU DROWSY       . prochlorperazine (COMPAZINE) 10 MG tablet Take 10 mg by mouth every 6 (six) hours as needed. FOR NAUSEA        Labs:  Lab Results  Component Value Date   WBC 6.3 07/16/2011   HGB 11.3* 07/16/2011   HCT 32.7* 07/16/2011   MCV 98.6 07/16/2011   PLT 299 07/16/2011   Lab Results  Component Value Date   CREATININE 1.24* 06/04/2011   CREATININE 1.24* 06/04/2011   BUN 34* 06/04/2011   BUN 34* 06/04/2011   NA 139 06/04/2011   NA 139 06/04/2011   K 4.4 06/04/2011   K 4.4 06/04/2011   CL 103 06/04/2011   CL 103 06/04/2011    CO2 25 06/04/2011   CO2 25 06/04/2011   Lab Results  Component Value Date   ALT 22 06/04/2011   ALT 22 06/04/2011   AST 23 06/04/2011   AST 23 06/04/2011   BILITOT 0.5 06/04/2011   BILITOT 0.5 06/04/2011    Physical Examination:  weight is 246 lb 8 oz (111.812 kg). Her temperature is 99.4 F (37.4 C). Her blood pressure is 122/77 and her pulse is 78.    Wt Readings from Last 3 Encounters:  07/16/11 246 lb 8 oz (111.812 kg)  07/16/11 246 lb (111.585 kg)  07/09/11 243 lb 8 oz (110.451 kg)     Lungs - Normal respiratory effort, chest expands symmetrically. Lungs are clear to auscultation, no crackles or wheezes.  Heart has regular rhythm and rate  Abdomen is soft and non tender with normal bowel sounds  Assessment:  Patient tolerating treatments well  Plan: Continue treatment per original radiation prescription

## 2011-07-17 ENCOUNTER — Ambulatory Visit
Admission: RE | Admit: 2011-07-17 | Discharge: 2011-07-17 | Disposition: A | Discharge status: Home / Self Care | Payer: Self-pay | Source: Ambulatory Visit | Attending: Radiation Oncology | Admitting: Radiation Oncology

## 2011-07-18 ENCOUNTER — Ambulatory Visit
Admission: RE | Admit: 2011-07-18 | Discharge: 2011-07-18 | Disposition: A | Discharge status: Home / Self Care | Payer: Self-pay | Source: Ambulatory Visit | Attending: Radiation Oncology | Admitting: Radiation Oncology

## 2011-07-19 ENCOUNTER — Ambulatory Visit
Admission: RE | Admit: 2011-07-19 | Discharge: 2011-07-19 | Disposition: A | Discharge status: Home / Self Care | Payer: Self-pay | Source: Ambulatory Visit | Attending: Radiation Oncology | Admitting: Radiation Oncology

## 2011-07-21 ENCOUNTER — Telehealth: Payer: Self-pay | Admitting: Oncology

## 2011-07-21 NOTE — Progress Notes (Signed)
OFFICE PROGRESS NOTE Date of Visit 07-16-2011 Physicians: w.Brewster, C.Sherryll Burger, J.Kerr  INTERVAL HISTORY:   Patient is seen, alone for visit today, in continuing attention to her endometrial carcinoma, this IIIA grade 1 at surgery by Dr.Brewster 02-19-11. She has had 3 cycles of taxotere/carboplatin thru 05-27-11, with plan to do additional 3 cycles after RT completes. She continues external beam to pelvis planned thru 07-24-2011, then will have intracavitary brachytherapy (those dates not yet in  EMR). She has been tolerating RT generally well although she has been more fatigued this week. She has had occasional nausea controlled with prn compazine and zofran, no diarrhea, no bladder symptoms, no bleeding. Blood sugars apparently have been in reasonable range. She had respiratory infection beginning Dec 29, gradually improving tho still some cough which is mostly nonproductive, not SOB, no chest pain or fever now. She has continued to work.  Review of Systems: appetite ok. No abd or pelvic pain. No significant skin reaction from RT. No swelling LE. Is able to sleep.   Objective:  Vital signs in last 24 hours:  BP 134/79  Pulse 80  Temp 98 F (36.7 C)  Ht 5' 6.5" (1.689 m)  Wt 246 lb (111.585 kg)  BMI 39.11 kg/m2   Ambulatory without assistance. Alert and very pleasant as always. Occasional nonproductive cough during exam. Respiratiions not labored RA. HEENT:mucous membranes moist, pharynx normal without lesions. PERRL.No JVD LymphaticsCervical, supraclavicular, and axillary nodes normal.No inguinal adenopathy Resp: clear to auscultation bilaterally and normal percussion bilaterally Cardio: regular rate and rhythm GI: soft, non-tender; bowel sounds normal; no masses,  no organomegaly Extremities: extremities normal, atraumatic, no cyanosis or edema Skin without erythema in pubic area. No focal neuro deficits   Lab Results:  CBC   WBC 6.3, ANC 5.0, Hgb 11.3, plt  299k  BMET/CMET NA 135, K 4.5, Cl 105, CO2 27, glu 117, BUN 31, creat 1.06 and remainder of full CMET WNL  Studies/Results:  No results found.  Medications: I have reviewed the patient's current medications.  Assessment/Plan:  1. Endometrial carcinoma: IIIA grade 1 with other history as above: I have not given her further appointments today, but will need these to continue taxotere/carboplatin chemotherapy when RT schedule is available. 2.Diabetes on insulin 3.Respiratory infection, probably viral, gradually improving 4. Chronic peripheral neuropathy especially in feet such that taxotere has been used in preference to taxol chemotherapy 5.HTN, hx PUD, hx gout, obesity       LIVESAY,LENNIS P, MD   07/21/2011, 12:06 PM

## 2011-07-21 NOTE — Telephone Encounter (Signed)
LM for gyn onc re timing of chemotherapy and upcoming brachytherapy.

## 2011-07-22 ENCOUNTER — Ambulatory Visit
Admission: RE | Admit: 2011-07-22 | Discharge: 2011-07-22 | Disposition: A | Discharge status: Home / Self Care | Payer: Self-pay | Source: Ambulatory Visit | Attending: Radiation Oncology | Admitting: Radiation Oncology

## 2011-07-23 ENCOUNTER — Ambulatory Visit
Admission: RE | Admit: 2011-07-23 | Discharge: 2011-07-23 | Disposition: A | Discharge status: Home / Self Care | Payer: Self-pay | Source: Ambulatory Visit | Attending: Radiation Oncology | Admitting: Radiation Oncology

## 2011-07-23 ENCOUNTER — Encounter: Payer: Self-pay | Admitting: Radiation Oncology

## 2011-07-23 NOTE — Progress Notes (Signed)
Encounter addended by: Billie Lade, MD on: 07/23/2011  5:13 PM<BR>     Documentation filed: Orders, Flowsheet VN

## 2011-07-23 NOTE — Progress Notes (Signed)
CC:   Reece Packer, M.D. Laurette Schimke, MD  DIAGNOSIS:  Stage IIIA endometrial cancer.  NARRATIVE:  Mrs. Nevers is seen today for weekly assessment.  She has completed 24/25 planned external beam radiation treatments (4320 cGy of a planned 4500 cGy).  The patient continues to have some problems with diarrhea, approximately 3-4 loose stools per day.  She occasionally will take Imodium which helped this issue.  The patient has also noticed some erythema and hyperpigmentation changes in the peri-buttocks, area but no skin breakdown is appreciated.  The patient does have some dysuria and obtained some over-the-counter Azo which has helped this symptom.  The patient denies any strong odor to her urine or hematuria.  The patient denies any vaginal bleeding.  EXAMINATION:  Vital signs: The patient's temperature is 96.8, pulse 74, blood pressure is 133/78, and weight is 249 pounds.  Examination of the lungs reveals them to be clear at this time.  The heart has a regular rhythm and rate.  The abdomen is soft and nontender with normal bowel sounds.  IMPRESSION AND PLAN:  The patient is tolerating her treatments reasonably well except for issues as above.  The patient's radiation fields are setting up accurately.  The patient's radiation chart was checked today.  The patient will continue with 1 more external beam treatment and then proceed with intracavitary brachytherapy treatments. The patient wishes a short break in light of her diarrhea issues until she starts her brachytherapy.  The patient will be set up for her 1st brachytherapy treatment on February 4th. The patient could resume her chemotherapy during her HDR treatments if Dr. Darrold Span is agreeable to this issue.  The patient will have 3 weekly HDR treatments.    ______________________________ Billie Lade, Ph.D., M.D. JDK/MEDQ  D:  07/23/2011  T:  07/23/2011  Job:  [id number]

## 2011-07-23 NOTE — Progress Notes (Signed)
Patient presents to the clinic today unaccompanied for under treat visit with Dr. Roselind Messier. Patient is alert and oriented to person, place, and time. No distress noted. Steady gait noted. Pleasant affect noted. Patient denies pain at this time. Patient reports averaging 3-4 episodes of diarrhea per day despite medication. Patient reports hyperpigmentation of her "butt." Reported all findings to Dr. Roselind Messier.

## 2011-07-24 ENCOUNTER — Ambulatory Visit
Admission: RE | Admit: 2011-07-24 | Discharge: 2011-07-24 | Disposition: A | Discharge status: Home / Self Care | Payer: Self-pay | Source: Ambulatory Visit | Attending: Radiation Oncology | Admitting: Radiation Oncology

## 2011-07-25 ENCOUNTER — Telehealth: Payer: Self-pay

## 2011-07-25 ENCOUNTER — Ambulatory Visit: Payer: Self-pay

## 2011-07-26 ENCOUNTER — Ambulatory Visit: Payer: Self-pay

## 2011-07-29 ENCOUNTER — Ambulatory Visit: Payer: Self-pay

## 2011-07-30 ENCOUNTER — Ambulatory Visit: Payer: Self-pay

## 2011-07-30 NOTE — Telephone Encounter (Signed)
error 

## 2011-07-31 ENCOUNTER — Ambulatory Visit: Payer: Self-pay

## 2011-08-02 ENCOUNTER — Other Ambulatory Visit: Payer: Self-pay | Admitting: Oncology

## 2011-08-02 ENCOUNTER — Telehealth: Payer: Self-pay

## 2011-08-02 ENCOUNTER — Telehealth: Payer: Self-pay | Admitting: *Deleted

## 2011-08-02 DIAGNOSIS — C541 Malignant neoplasm of endometrium: Secondary | ICD-10-CM

## 2011-08-02 NOTE — Telephone Encounter (Signed)
SPOKE WITH MS. PANELL AND TOLD HER THAT DR. Darrold Span SENT ORDERS TO THE SCHEDULERS TO SET UP AN APPT. FOR LAB AND VISIT ON 08-20-11 AT 0900.  SHE WILL RECEIVE CYCLE 4 /6 CHEMOTHERAPY ON 08-26-11. SHE STATED THAT SHE HAD ENOUGH ANTIEMETICS ON HAND.  SHE HAS ENOUGH DECADRON FOR 1 TREATMENT.  TOLD HER THAT WE CAN REFILL AT 08-20-11 VISIT FOR SUBSEQUENT TREATMENTS. REQUESTED THAT SHE LEAVE THIS NURSE A MESSAGE IF SHE HAS NOT HEARD FROM THE SCHEDULING DEPT. BY 08-09-11.  PT. VERBALIZED UNDERSTANDING.

## 2011-08-03 ENCOUNTER — Encounter: Payer: Self-pay | Admitting: Radiation Oncology

## 2011-08-03 ENCOUNTER — Other Ambulatory Visit: Payer: Self-pay | Admitting: Radiation Oncology

## 2011-08-03 DIAGNOSIS — C541 Malignant neoplasm of endometrium: Secondary | ICD-10-CM

## 2011-08-03 DIAGNOSIS — R300 Dysuria: Secondary | ICD-10-CM

## 2011-08-03 MED ORDER — PHENAZOPYRIDINE HCL 95 MG PO TABS: 95.0000 mg | ORAL_TABLET | Freq: Three times a day (TID) | ORAL | Status: AC | PRN

## 2011-08-03 MED ORDER — CIPROFLOXACIN HCL 250 MG PO TABS: 250.0000 mg | ORAL_TABLET | Freq: Two times a day (BID) | ORAL | Status: AC

## 2011-08-05 ENCOUNTER — Ambulatory Visit
Admission: RE | Admit: 2011-08-05 | Discharge: 2011-08-05 | Disposition: A | Discharge status: Home / Self Care | Payer: Self-pay | Source: Ambulatory Visit | Attending: Radiation Oncology | Admitting: Radiation Oncology

## 2011-08-05 ENCOUNTER — Other Ambulatory Visit: Payer: Self-pay | Admitting: Radiation Oncology

## 2011-08-05 ENCOUNTER — Telehealth: Payer: Self-pay | Admitting: Oncology

## 2011-08-05 DIAGNOSIS — C541 Malignant neoplasm of endometrium: Secondary | ICD-10-CM

## 2011-08-05 LAB — URINALYSIS, MICROSCOPIC - CHCC

## 2011-08-05 NOTE — Procedures (Signed)
DIAGNOSIS:  Endometrial cancer.  Below is a summary of the patient's examination, setup and treatment for her first high-dose rate treatment.  Vaginal brachytherapy procedure.  The patient was taken to the nurse's suite and placed in the dorsal lithotomy position.  The patient proceeded to undergo pelvic examination.  There were no pelvic masses appreciated on bimanual examination.  There were no mucosal lesions noted in the vaginal vault.  The patient was noted to have some mild skin changes related to her external beam radiation therapy.  The patient then proceeded to undergo serial dilatation of the vaginal introitus.  The patient had construction of a 3 cm diameter cylinder which distended the vaginal vault without undue discomfort.  The patient tolerated the procedure well.  Simple treatment device.  The patient had construction of a 3 cm diameter ring system.  The patient will be treated with three 3.0 cm diameter rings placed proximally within the vaginal vault.  More distally will be two 2.5 cm diameter rings.  Simulation and treatment planning note.  The patient was subsequently transferred to the CT simulation suite.  The patient's previously constructed vaginal cylinder was then placed in the proximal vagina. The cylinder was then affixed to the CT/MR stabilization plate to prevent slippage.  The patient also had a rectal tube placed with contrast instilled into the rectal vault.  She then proceeded to undergo CT scan in the treatment position.  Under virtual simulation the vaginal cylinder along with bladder and rectum as well as rectosigmoid colon was outlined.  The patient then proceeded to undergo planning for her high- dose rate treatment.  PLAN:  The patient is to proceed with 3 weekly treatments.  The patient will receive 600 cGy to the proximal vaginal mucosal surface.  The treatment length will be 3 cm with 7 dwell positions.  Verification simulation.  After  planning was complete the patient was transferred to the high-dose rate suite.  The patient's vaginal cylinder was reinserted and affixed to the CT/MR stabilization plate to prevent slippage.  The patient then had a fiducial marker placed within the vaginal cylinder.  An AP and lateral film was obtained in the treatment position.  This was compared to the patient's planning films earlier in the day documenting good position of the vaginal cylinder for treatment.  High-dose rate brachytherapy procedure.  The patient then had attachment of the remote afterloading catheter to the vaginal cylinder.  The patient then proceeded to undergo her first high-dose rate treatment. The patient was prescribed a dose of 600 cGy to the mucosal surface. This was achieved with a total dwell time of 259.8 seconds.  The patient was treated with 1 channel using 7 dwell positions.  Iridium 192 was the high-dose rate source.  The patient tolerated the procedure well.  After completion of the patient's therapy a radiation survey was performed documenting return of the iridium source into the Nucletron safe.  The patient will return next week for her second high-dose rate treatment.   ______________________________ Billie Lade, Ph.D., M.D. JDK/MEDQ  D:  08/05/2011  T:  08/05/2011  Job:  [id number]

## 2011-08-05 NOTE — Telephone Encounter (Signed)
Talked to pt, gave hwer appt on 08/20/11 , lab and md. Pt will have chemo infusion on 08/26/11, she is aware of appt

## 2011-08-05 NOTE — Progress Notes (Signed)
Here for HDR treatment. Pt. Had significant pain on urination as well as frequency.started on pyridium adn cipro per Dr.Squire on 08/03/11. Symptoms have improved. Diarrhea  x3 on 08/04/11.

## 2011-08-07 LAB — URINE CULTURE

## 2011-08-09 ENCOUNTER — Telehealth: Payer: Self-pay | Admitting: *Deleted

## 2011-08-12 ENCOUNTER — Ambulatory Visit
Admission: RE | Admit: 2011-08-12 | Discharge: 2011-08-12 | Disposition: A | Discharge status: Home / Self Care | Payer: Self-pay | Source: Ambulatory Visit | Attending: Radiation Oncology | Admitting: Radiation Oncology

## 2011-08-12 NOTE — Procedures (Signed)
DIAGNOSIS:  Endometrial cancer.  NARRATIVE:  Earlier today Ms. Glazier underwent her second high-dose rate treatment.  Below is a summary of her setup and treatment.  Vaginal brachytherapy procedure.  The patient was placed on the high- dose rate treatment table in the dorsal lithotomy position.  The patient proceeded to undergo serial dilatation of the vaginal introitus.  The patient's previously constructed vaginal cylinder was placed in the proximal vagina.  The cylinder was then affixed to the CT/MR stabilization plate to prevent slippage.  The patient tolerated the procedure well with minimal discomfort.  Verification simulation.  The patient then had a fiducial marker placed within the vaginal cylinder.  An AP and lateral film was obtained in the treatment position.  This was compared to the patient's planning films last week documenting good position of the vaginal cylinder for treatment.  High-dose rate brachytherapy procedure.  The patient then had attachment of her vaginal cylinder to the remote afterloading device.  The patient then proceeded to undergo her second high-dose rate treatment.  The patient prescribed a dose of 600 cGy to be delivered to the proximal vaginal mucosal surface.  This was achieved with a total dwell time of 277.3 seconds.  The patient was treated with iridium 192 as the high- dose rate source.  The patient was treated with 1 channel using 7 dwell positions.  The patient tolerated the procedure well.  After completion of her therapy a radiation survey was performed documenting return of the iridium source into the Nucletron safe.  The patient will return next week for her third high-dose rate treatment.    ______________________________ Billie Lade, Ph.D., M.D. JDK/MEDQ  D:  08/12/2011  T:  08/12/2011  Job:  [id number]

## 2011-08-19 ENCOUNTER — Telehealth: Payer: Self-pay | Admitting: *Deleted

## 2011-08-19 ENCOUNTER — Encounter: Payer: Self-pay | Admitting: Radiation Oncology

## 2011-08-20 ENCOUNTER — Encounter: Payer: Self-pay | Admitting: Oncology

## 2011-08-20 ENCOUNTER — Ambulatory Visit (HOSPITAL_BASED_OUTPATIENT_CLINIC_OR_DEPARTMENT_OTHER): Payer: Self-pay | Admitting: Oncology

## 2011-08-20 ENCOUNTER — Ambulatory Visit
Admission: RE | Admit: 2011-08-20 | Discharge: 2011-08-20 | Disposition: A | Discharge status: Home / Self Care | Payer: Self-pay | Source: Ambulatory Visit | Attending: Radiation Oncology | Admitting: Radiation Oncology

## 2011-08-20 ENCOUNTER — Other Ambulatory Visit: Payer: Self-pay | Admitting: Lab

## 2011-08-20 VITALS — BP 137/84 | HR 81 | Temp 96.8°F | Ht 66.5 in | Wt 240.9 lb

## 2011-08-20 DIAGNOSIS — C549 Malignant neoplasm of corpus uteri, unspecified: Secondary | ICD-10-CM

## 2011-08-20 DIAGNOSIS — E119 Type 2 diabetes mellitus without complications: Secondary | ICD-10-CM | POA: Insufficient documentation

## 2011-08-20 DIAGNOSIS — C541 Malignant neoplasm of endometrium: Secondary | ICD-10-CM

## 2011-08-20 DIAGNOSIS — E1142 Type 2 diabetes mellitus with diabetic polyneuropathy: Secondary | ICD-10-CM

## 2011-08-20 DIAGNOSIS — I10 Essential (primary) hypertension: Secondary | ICD-10-CM

## 2011-08-20 DIAGNOSIS — E1149 Type 2 diabetes mellitus with other diabetic neurological complication: Secondary | ICD-10-CM

## 2011-08-20 DIAGNOSIS — E1143 Type 2 diabetes mellitus with diabetic autonomic (poly)neuropathy: Secondary | ICD-10-CM

## 2011-08-20 LAB — CBC WITH DIFFERENTIAL/PLATELET
BASO%: 0.1 % (ref 0.0–2.0)
EOS%: 0 % (ref 0.0–7.0)
HCT: 34.6 % — ABNORMAL LOW (ref 34.8–46.6)
MCH: 33.1 pg (ref 25.1–34.0)
MCHC: 34.7 g/dL (ref 31.5–36.0)
MONO#: 0.6 10*3/uL (ref 0.1–0.9)
NEUT%: 91.3 % — ABNORMAL HIGH (ref 38.4–76.8)
RBC: 3.63 10*6/uL — ABNORMAL LOW (ref 3.70–5.45)
RDW: 13.1 % (ref 11.2–14.5)
WBC: 16.3 10*3/uL — ABNORMAL HIGH (ref 3.9–10.3)
lymph#: 0.8 10*3/uL — ABNORMAL LOW (ref 0.9–3.3)

## 2011-08-20 LAB — BASIC METABOLIC PANEL
Calcium: 10.4 mg/dL (ref 8.4–10.5)
Glucose, Bld: 183 mg/dL — ABNORMAL HIGH (ref 70–99)
Potassium: 4.7 mEq/L (ref 3.5–5.3)
Sodium: 137 mEq/L (ref 135–145)

## 2011-08-20 NOTE — Procedures (Signed)
DIAGNOSIS:  Endometrial cancer.  NARRATIVE:  Earlier today Lisa Morales underwent her 3rd high-dose rate treatment. Below is a summary of her setup and treatment session.  VAGINAL BRACHYTHERAPY PROCEDURE:  The patient was placed on the high- dose rate treatment table in the dorsal lithotomy position.  The patient proceeded to undergo serial dilatation of the vaginal introitus.  The patient then proceeded to have placement of her previously constructed vaginal cylinder.  The vaginal cylinder was then affixed to the CT/MR stabilization plate to prevent slippage.  The patient tolerated the procedure well.  VERIFICATION SIMULATION:  Patient then had a fiducial marker placed within the vaginal cylinder.  An AP and lateral film was obtained in the treatment position documenting good position of the vaginal cylinder for treatment compared to her planning films.  HIGH-DOSE RATE BRACHYTHERAPY PROCEDURE:  Patient then had attachment of her vaginal cylinder to the remote after-loading device through a catheter system.  The patient proceeded to undergo her 3rd high-dose rate treatment.  The patient was prescribed a dose of 600 cGy to the mucosal surface.  This was achieved with a total dwell time of 298.7 seconds.  The patient was treated with 1 channel using 7 dwell positions.  Iridium 192 was the high-dose rate source.  Patient tolerated the treatment well.  After completion of her therapy, a radiation survey was performed documenting return of the iridium source into the Nucletron safe.    ______________________________ Billie Lade, Ph.D., M.D. JDK/MEDQ  D:  08/20/2011  T:  08/20/2011  Job:  [id number]

## 2011-08-20 NOTE — Patient Instructions (Signed)
Decadron (dexamethasone, steroid) 4 mg = 1 tablet twice daily x 3 days begin day prior to taxotere.

## 2011-08-20 NOTE — Progress Notes (Signed)
OFFICE PROGRESS NOTE Date of Visit 08-20-2011 Physicians: W.Brewster, J.Kinard, A.Ross, J.Kerr  INTERVAL HISTORY:   Patient is seen, alone for visit today, in continuing attention to endometrial carcinoma, this IIIA grade 1 at laparoscopic hysterectomy/BSO/right pelvic node evaluation by Dr.Brewster 02-19-11. She has now been treated with 3 cycles of taxotere/ carboplatin thru 05-27-11 (did require neulasta); she has now completed external beam radiation and will have last brachytherapy later today. Plan is to resume same chemotherapy for 3 additional cycles. Patient was confused about premedication steroids for taxotere, which she took yesterday AM and PM, tho chemotherapy is not scheduled until next week. Patient tells me that she has been tolerating radiation generally well, no longer with any skin reaction or bowel or bladder symptoms from external beam. The brachytherapy has been uncomfortable and she is delighted that today is last treatment. She has continued to work, blood sugars have not been extremely high, and she has had no fever. Appetite is adequate, no bleeding, no abdominal or pelvic pain, no increased shortness of breath. She does have stable neuropathic symptoms mostly in feet, thought related to diabetes.  Remainder of 10 point Review of Systems negative/ unchanged.  Objective:  Vital signs in last 24 hours:  BP 137/84  Pulse 81  Temp(Src) 96.8 F (36 C) (Oral)  Ht 5' 6.5" (1.689 m)  Wt 240 lb 14.4 oz (109.272 kg)  BMI 38.30 kg/m2  She is much louder and more exuberant today, on steroids. Easily mobile, no apparent discomfort.  HEENT:mucous membranes moist, pharynx normal without lesions. PERRL. No complete alopecia. LymphaticsCervical, supraclavicular, and axillary nodes normal.No inguinal adenopathy Resp: clear to auscultation bilaterally and normal percussion bilaterally Cardio: regular rate and rhythm GI: soft, non-tender; bowel sounds normal; no masses,  no  organomegaly Extremities: extremities normal, atraumatic, no cyanosis or edema No skin reaction in inguinal/ suprapubic area. Neuro:decreased light touch feet bilaterally    Lab Results:   Basename 08/20/11 0829  WBC 16.3*  HGB 12.0  HCT 34.6*  PLT 288  ANC 14.9 Elevated WBC and ANC likely due to steroids in last 24 hrs WBC likely elevated due to steroids taken yesterday. BMET  Basename 08/20/11 0829  NA 137  K 4.7  CL 99  CO2 23  GLUCOSE 183*  BUN 36*  CREATININE 1.51*  CALCIUM 10.4  Chemistries resulted after visit, this creatinine up from 1.06 in Jan and 1.24 in Dec. Urine culture from Feb 4 no growth Studies/Results:  No results found.  Medications: I have reviewed the patient's current medications. NOTE SHE PREFERS MON AND TUES FOR VISITS AND RX Assessment/Plan:  1. IIIA grade 1 endometrial carcinoma: to resume final planned 3 cycles of taxotere/ carboplatin on 2-25:  With increase in creatinine today, we will have her push fluids and will repeat stat BMET day of chemo. Will hold carbo if creatinine >=1.5 (treat with taxotere only on 2-25 if so); will treat with [place] also if creatinine < 1.5 but will not increase dose above that calculated now using creat 1.5 (ie do not increase carbo dose if creatinine is better on 2-25). She will have CBG day of chemo, covering with SS insulin.She will have neulasta 2-26 and I will see her on 3-5 with CBC and CMET. She will be due cycle 5 on 3-18 with CBC and CBG, with neulasta 3-29, then visit back to me 4-1 again with CBC/CMET. 2.Diabetes with peripheral neuropathy preceeding chemo such that taxotere has been used instead of taxol. 3.PAC in 4.HTN, PUD,gout, obesity  Patient is in agreement with plan        Reece Packer, MD   08/20/2011, 8:04 PM

## 2011-08-21 ENCOUNTER — Telehealth: Payer: Self-pay | Admitting: *Deleted

## 2011-08-21 ENCOUNTER — Telehealth: Payer: Self-pay | Admitting: Oncology

## 2011-08-21 ENCOUNTER — Encounter: Payer: Self-pay | Admitting: Oncology

## 2011-08-21 ENCOUNTER — Other Ambulatory Visit: Payer: Self-pay | Admitting: *Deleted

## 2011-08-21 DIAGNOSIS — C541 Malignant neoplasm of endometrium: Secondary | ICD-10-CM

## 2011-08-21 NOTE — Telephone Encounter (Signed)
Called pt , left message regarding appt on 08/26/11 lab and chemo. Requested pt to get calendar appt for the month of February and March 2013

## 2011-08-21 NOTE — Procedures (Signed)
DIAGNOSIS:  Endometrial cancer.  NARRATIVE:  On June 17, 2011, Mrs. Meinhart had completion of her 3D conformal plan directed at the pelvis area.  Conformal therapy was chosen in order to more accurately cover the target volumes and to avoid critical structures.  Dose volume histograms of the target volume as well as critical structures including the rectum, bowel, and femoral head and neck area were reviewed, accepted, and placed in the patient's chart.  Plan is for the patient to receive 25 treatments at 180 cGy per day for a cumulative external beam dose of 4500 centigray.    ______________________________ Billie Lade, Ph.D., M.D. JDK/MEDQ  D:  08/21/2011  T:  08/21/2011  Job:  [id number]

## 2011-08-21 NOTE — Telephone Encounter (Signed)
Spoke with pt's husband b/c pt is at work & unable to contact at this time.  Message given to have pt push po fluids starting now until her chemo b/c kidney functions a little high.  Reported that we would recheck stat b-met day of chemo & may or may not get [place] depending on results.  She will get taxotere & reminded to have pt take her steroids starting 08/25/11.

## 2011-08-21 NOTE — Progress Notes (Signed)
CC:   Reece Packer, M.D. Laurette Schimke, MD Magnus Sinning) Tenny Craw, M.D. Tonita Cong, M.D. Elby Showers, MD  DIAGNOSIS:  Stage IIIA endometrial cancer.  INDICATION FOR THERAPY:  Postop adjuvant.  TREATMENT DATES: 1. External beam radiation therapy June 18, 2011 through July 24, 2011. 2. High dose rate brachytherapy treatments 02/04, 02/11, and 02/19.  SITE/DOSE:  Pelvis 4500 cGy in 25 fractions (180 cGy per fraction).  The vaginal cuff area was boosted further with brachytherapy to a cumulative dose of 6300 cGy.  ENERGY/FIELD:  The patient was treated with 3D conformal therapy using 18 mV photons for her initial external beam treatments.  The patient completed 4500 cGy.  The patient then proceeded to undergo intracavitary brachytherapy treatments directed at the proximal vagina.  The patient was treated with a 3-cm diameter cylinder with a treatment length of 3 cm.  Iridium 192 was the high-dose rate source.  The patient received 600 cGy to the mucosal surface with 3 weekly treatments for a cumulative brachytherapy dose of 1800 cGy.  NARRATIVE:  Mrs. Castrellon tolerated her treatments reasonably well.  She did have some mild problems with nausea as well as diarrhea during the course of her external beam treatments.  In addition, the patient had some mild dysuria as well as fatigue.  The patient tolerated her brachytherapy treatments well except for some mild discomfort in the vulvovaginal area.  FOLLOWUP APPOINTMENT:  One month.    ______________________________ Billie Lade, Ph.D., M.D. JDK/MEDQ  D:  08/21/2011  T:  08/21/2011  Job:  [id number]

## 2011-08-22 ENCOUNTER — Other Ambulatory Visit: Payer: Self-pay | Admitting: Oncology

## 2011-08-23 ENCOUNTER — Other Ambulatory Visit: Payer: Self-pay | Admitting: Oncology

## 2011-08-23 DIAGNOSIS — C541 Malignant neoplasm of endometrium: Secondary | ICD-10-CM | POA: Insufficient documentation

## 2011-08-23 NOTE — Telephone Encounter (Signed)
xxx

## 2011-08-25 ENCOUNTER — Other Ambulatory Visit: Payer: Self-pay | Admitting: Oncology

## 2011-08-26 ENCOUNTER — Ambulatory Visit (HOSPITAL_BASED_OUTPATIENT_CLINIC_OR_DEPARTMENT_OTHER): Payer: Self-pay

## 2011-08-26 ENCOUNTER — Other Ambulatory Visit: Payer: Self-pay | Admitting: Oncology

## 2011-08-26 ENCOUNTER — Other Ambulatory Visit: Payer: Self-pay | Admitting: Certified Registered Nurse Anesthetist

## 2011-08-26 ENCOUNTER — Other Ambulatory Visit (HOSPITAL_BASED_OUTPATIENT_CLINIC_OR_DEPARTMENT_OTHER): Payer: Self-pay | Admitting: Lab

## 2011-08-26 DIAGNOSIS — Z5111 Encounter for antineoplastic chemotherapy: Secondary | ICD-10-CM

## 2011-08-26 DIAGNOSIS — C541 Malignant neoplasm of endometrium: Secondary | ICD-10-CM

## 2011-08-26 DIAGNOSIS — C549 Malignant neoplasm of corpus uteri, unspecified: Secondary | ICD-10-CM

## 2011-08-26 LAB — CBC WITH DIFFERENTIAL/PLATELET
BASO%: 0.1 % (ref 0.0–2.0)
EOS%: 0 % (ref 0.0–7.0)
HCT: 33.8 % — ABNORMAL LOW (ref 34.8–46.6)
MCH: 32.4 pg (ref 25.1–34.0)
MCHC: 34 g/dL (ref 31.5–36.0)
MONO#: 0.6 10*3/uL (ref 0.1–0.9)
NEUT%: 90.7 % — ABNORMAL HIGH (ref 38.4–76.8)
RBC: 3.55 10*6/uL — ABNORMAL LOW (ref 3.70–5.45)
RDW: 13.1 % (ref 11.2–14.5)
WBC: 13.3 10*3/uL — ABNORMAL HIGH (ref 3.9–10.3)
lymph#: 0.7 10*3/uL — ABNORMAL LOW (ref 0.9–3.3)

## 2011-08-26 LAB — BASIC METABOLIC PANEL
CO2: 25 mEq/L (ref 19–32)
Chloride: 96 mEq/L (ref 96–112)
Creatinine, Ser: 1.11 mg/dL — ABNORMAL HIGH (ref 0.50–1.10)
Sodium: 133 mEq/L — ABNORMAL LOW (ref 135–145)

## 2011-08-26 LAB — WHOLE BLOOD GLUCOSE
Glucose: 176 mg/dL — ABNORMAL HIGH (ref 70–100)
HRS PC: 4 Hours

## 2011-08-26 MED ORDER — SODIUM CHLORIDE 0.9 % IV SOLN
280.0000 mg | Freq: Once | INTRAVENOUS | Status: AC
  Administered 2011-08-26: 280 mg via INTRAVENOUS
  Filled 2011-08-26: qty 28

## 2011-08-26 MED ORDER — SODIUM CHLORIDE 0.9 % IV SOLN
Freq: Once | INTRAVENOUS | Status: AC
  Administered 2011-08-26: 12:00:00 via INTRAVENOUS

## 2011-08-26 MED ORDER — ONDANSETRON 16 MG/50ML IVPB (CHCC)
16.0000 mg | Freq: Once | INTRAVENOUS | Status: AC
  Administered 2011-08-26: 16 mg via INTRAVENOUS

## 2011-08-26 MED ORDER — INSULIN REGULAR HUMAN 100 UNIT/ML IJ SOLN
2.0000 [IU] | INTRAMUSCULAR | Status: DC
  Filled 2011-08-26: qty 0.02

## 2011-08-26 MED ORDER — DOCETAXEL CHEMO INJECTION 160 MG/16ML: 53.0000 mg/m2 | Freq: Once | INTRAVENOUS | Status: DC

## 2011-08-26 MED ORDER — DEXAMETHASONE SODIUM PHOSPHATE 4 MG/ML IJ SOLN
20.0000 mg | Freq: Once | INTRAMUSCULAR | Status: AC
  Administered 2011-08-26: 20 mg via INTRAVENOUS

## 2011-08-26 MED ORDER — SODIUM CHLORIDE 0.9 % IV SOLN
53.0000 mg/m2 | Freq: Once | INTRAVENOUS | Status: AC
  Administered 2011-08-26: 120 mg via INTRAVENOUS
  Filled 2011-08-26: qty 12

## 2011-08-26 NOTE — Progress Notes (Signed)
BS 138 checked at bedside by pt.  No need for insulin prior to lunch today per Dr. Ferol Luz orders. HL

## 2011-08-27 ENCOUNTER — Ambulatory Visit (HOSPITAL_BASED_OUTPATIENT_CLINIC_OR_DEPARTMENT_OTHER): Payer: Self-pay

## 2011-08-27 DIAGNOSIS — Z5189 Encounter for other specified aftercare: Secondary | ICD-10-CM

## 2011-08-27 DIAGNOSIS — C541 Malignant neoplasm of endometrium: Secondary | ICD-10-CM

## 2011-08-27 DIAGNOSIS — C549 Malignant neoplasm of corpus uteri, unspecified: Secondary | ICD-10-CM

## 2011-08-27 MED ORDER — PEGFILGRASTIM INJECTION 6 MG/0.6ML
6.0000 mg | Freq: Once | SUBCUTANEOUS | Status: AC
  Administered 2011-08-27: 6 mg via SUBCUTANEOUS
  Filled 2011-08-27: qty 0.6

## 2011-09-03 ENCOUNTER — Other Ambulatory Visit (HOSPITAL_BASED_OUTPATIENT_CLINIC_OR_DEPARTMENT_OTHER): Payer: Self-pay | Admitting: Lab

## 2011-09-03 ENCOUNTER — Other Ambulatory Visit: Payer: Self-pay

## 2011-09-03 ENCOUNTER — Ambulatory Visit: Payer: Self-pay | Admitting: Oncology

## 2011-09-03 ENCOUNTER — Encounter: Payer: Self-pay | Admitting: Oncology

## 2011-09-03 VITALS — BP 104/58 | HR 76 | Temp 97.1°F | Ht 66.5 in | Wt 240.7 lb

## 2011-09-03 DIAGNOSIS — C541 Malignant neoplasm of endometrium: Secondary | ICD-10-CM

## 2011-09-03 DIAGNOSIS — C549 Malignant neoplasm of corpus uteri, unspecified: Secondary | ICD-10-CM

## 2011-09-03 LAB — CBC WITH DIFFERENTIAL/PLATELET
Basophils Absolute: 0.1 10*3/uL (ref 0.0–0.1)
Eosinophils Absolute: 0.1 10*3/uL (ref 0.0–0.5)
HGB: 11.6 g/dL (ref 11.6–15.9)
MONO#: 0.3 10*3/uL (ref 0.1–0.9)
MONO%: 1.9 % (ref 0.0–14.0)
NEUT#: 16.7 10*3/uL — ABNORMAL HIGH (ref 1.5–6.5)
RBC: 3.49 10*6/uL — ABNORMAL LOW (ref 3.70–5.45)
RDW: 13.7 % (ref 11.2–14.5)
WBC: 18.3 10*3/uL — ABNORMAL HIGH (ref 3.9–10.3)
lymph#: 1 10*3/uL (ref 0.9–3.3)

## 2011-09-03 LAB — COMPREHENSIVE METABOLIC PANEL
Albumin: 4.2 g/dL (ref 3.5–5.2)
BUN: 51 mg/dL — ABNORMAL HIGH (ref 6–23)
CO2: 23 mEq/L (ref 19–32)
Calcium: 9.6 mg/dL (ref 8.4–10.5)
Glucose, Bld: 165 mg/dL — ABNORMAL HIGH (ref 70–99)
Potassium: 4.5 mEq/L (ref 3.5–5.3)
Sodium: 142 mEq/L (ref 135–145)
Total Protein: 6.5 g/dL (ref 6.0–8.3)

## 2011-09-03 MED ORDER — PAROXETINE HCL 20 MG PO TABS: 20.0000 mg | ORAL_TABLET | Freq: Every day | ORAL | Status: DC

## 2011-09-03 NOTE — Patient Instructions (Signed)
Paxil 20 mg from Dr Altamese Dilling in May 2003

## 2011-09-03 NOTE — Progress Notes (Signed)
OFFICE PROGRESS NOTE Date of Visit 09-03-2011 Physicians: W.Brewster, J.Kinard, A.Ross, J.Kerr   INTERVAL HISTORY:   Patient is seen, alone for visit, in continuing attention to her endometrial carcinoma ,this IIIA grade 1 at laparoscopic hysterectomy/BSO/right pelvic node evaluation by Dr.Brewster 02-19-11. She has now been treated with 3 cycles of taxotere/ carboplatin thru 05-27-11 (did require neulasta); external beam radiation and brachytherapy all completed 08-20-2011, and had #4 of total 6 planned cycles of taxotere/carboplatin on 08-26-2011 with neulasta 2-26.  Patient has done generally well again with the most recent treatment, tho she has had a more difficult time emotionally since chemo was resumed, "snappy and short-tempered". She had previously been on Paxil by Dr.Romine (2003 per [facility] now) which had been helpful and we will try that at least thru completion of planned treatment.  Otherwise she has had some fatigue and some aching after neulasta, but no fever or symptoms of infection, no significant nausea, no worsening of her chronic peripheral neuropathy (uses gabapentin), blood sugars back in usual range, no increased shortness of breath, no bleeding. Peripheral IV access took 2 attempts but still seems adequate to complete last 2 cycles. Remainder of full 10 point Review of Systems negative/ unchanged.  Objective:  Vital signs in last 24 hours:  BP 104/58  Pulse 76  Temp(Src) 97.1 F (36.2 C) (Oral)  Ht 5' 6.5" (1.689 m)  Wt 240 lb 11.2 oz (109.181 kg)  BMI 38.27 kg/m2 This weight is stable. Alert, a little tearful, easily ambulatory, very pleasant as always.  HEENT:mucous membranes moist, pharynx normal without lesions.PERRL. Hair is thin but no complete alopecia. LymphaticsCervical, supraclavicular, and axillary nodes normal.No inguinal adenopathy. Resp: clear to auscultation bilaterally and normal percussion bilaterally Cardio: regular rate and  rhythm GI: soft, non-tender; bowel sounds normal; no masses,  no organomegaly,obese. Extremities: without pitting edema, cords, tenderness. Minimal ecchymosis ay attempted IV site left hand Skin not remarkable Lab Results:   [facility] 09/03/11 0826  WBC 18.3*  HGB 11.6  HCT 34.4*  PLT 214  ANC 16.7 post neulasta  BMET  Basename 09/03/11 0826  NA 142  K 4.5  CL 104  CO2 23  GLUCOSE 165*  BUN 51*  CREATININE 1.39*  CALCIUM 9.6  CMET available after visit has AP 163, otherwise normal  Studies/Results:  No results found.  Medications: I have reviewed the patient's current medications. Paxil 20 mg daily will be resumed.  Assessment/Plan:  1.IIIA grade 1 endometrial carcinoma, post surgery 01-2011, initial 3 cycles taxotere/carboplatin followed by RT, and now for cycle #5 planned 3-18 with neulasta 3-19, as long as ANC >=1.5 and plt >=100k. 2.Diabetes: sliding scale insulin used to cover blood sugars day of chemotherapy, particularly as she uses decadron around each taxotere 3.emotional stress: resume Paxil as this had been helpful previously 4.peripheral neuropathy related to diabetes  I will see her back at least 09-30-11 or sooner if needed.        Reece Packer, MD   09/03/2011, 9:38 PM

## 2011-09-04 ENCOUNTER — Other Ambulatory Visit: Payer: Self-pay

## 2011-09-04 ENCOUNTER — Telehealth: Payer: Self-pay

## 2011-09-04 ENCOUNTER — Ambulatory Visit (HOSPITAL_BASED_OUTPATIENT_CLINIC_OR_DEPARTMENT_OTHER): Payer: Self-pay | Admitting: Lab

## 2011-09-04 DIAGNOSIS — C541 Malignant neoplasm of endometrium: Secondary | ICD-10-CM

## 2011-09-04 DIAGNOSIS — N390 Urinary tract infection, site not specified: Secondary | ICD-10-CM

## 2011-09-04 DIAGNOSIS — R3989 Other symptoms and signs involving the genitourinary system: Secondary | ICD-10-CM

## 2011-09-04 LAB — URINALYSIS, MICROSCOPIC - CHCC
Bilirubin (Urine): NEGATIVE
Glucose: NEGATIVE g/dL
Ketones: NEGATIVE mg/dL
Specific Gravity, Urine: 1.025 (ref 1.003–1.035)

## 2011-09-04 MED ORDER — CIPROFLOXACIN HCL 250 MG PO TABS: 250.0000 mg | ORAL_TABLET | Freq: Two times a day (BID) | ORAL | Status: AC

## 2011-09-04 NOTE — Telephone Encounter (Addendum)
Lisa Morales CALLED STATING THAT SHE BEGAN WITH BURNING UPON URINATION LAST NIGHT.   TODAY SHE HAS A TEMP 55F 99.0 AND VOMITING.   SHE ALSO TOOK OTC PHENAZOPYRIDINE HCL 95 MG LAST NIGHT. SHE WAS INCONTINENT OF STOOL X 1WEEK AND LAST NIGHT. TOLD HER DR. Darrold Span WANTS A UA THIS AFTERNOON. PT.'S UA INDICATES UTI.  URINE CULTURE SENT. PRESCRIPTION SENT TO [facility] FOR CIPRO 250 MG BID X 5 DAYS.  PT. VERBALIZED UNDERSTANDING.  PT. IS DRIKING 8 OZ OF FLUID EVRY ~90 MIN.

## 2011-09-09 ENCOUNTER — Other Ambulatory Visit: Payer: Self-pay

## 2011-09-09 ENCOUNTER — Telehealth: Payer: Self-pay

## 2011-09-09 DIAGNOSIS — Z87440 Personal history of urinary (tract) infections: Secondary | ICD-10-CM

## 2011-09-09 MED ORDER — CIPROFLOXACIN HCL 250 MG PO TABS: 250.0000 mg | ORAL_TABLET | Freq: Two times a day (BID) | ORAL | Status: AC

## 2011-09-09 NOTE — Telephone Encounter (Signed)
TOLD MS. PANELL THAT THE CULTURE DID SHOW  A URINE INFECTION WHICH WAS SENSITIVE TO CIPRO.  SHE HAS 1 TABLET LEFT AND CONTINUES WITH BURNING Ottis Stain WITH URINATION.   ELECTRONICALLY CALLED IN  5 MORE DAYS OF CIPRO PER DR. Darrold Span. TOLD MS. Homeyer TO LET us KNOW IF SHE STILL HAS SYMPTOMS WHEN SHE COMES FOR HER TREATMENT 09-16-11.  DR. Darrold Span SAID THAT SHE WOULD NEED ANOTHER UA/C&S AT THAT TIME.  PT. VERBALIZED UNDERSTANDING.

## 2011-09-14 ENCOUNTER — Encounter: Payer: Self-pay | Admitting: Radiation Oncology

## 2011-09-16 ENCOUNTER — Telehealth: Payer: Self-pay

## 2011-09-16 ENCOUNTER — Encounter: Payer: Self-pay | Admitting: Radiation Oncology

## 2011-09-16 ENCOUNTER — Telehealth: Payer: Self-pay | Admitting: Gynecologic Oncology

## 2011-09-16 ENCOUNTER — Ambulatory Visit (HOSPITAL_BASED_OUTPATIENT_CLINIC_OR_DEPARTMENT_OTHER): Payer: Self-pay

## 2011-09-16 ENCOUNTER — Other Ambulatory Visit (HOSPITAL_BASED_OUTPATIENT_CLINIC_OR_DEPARTMENT_OTHER): Payer: Self-pay | Admitting: Lab

## 2011-09-16 ENCOUNTER — Ambulatory Visit
Admission: RE | Admit: 2011-09-16 | Discharge: 2011-09-16 | Disposition: A | Discharge status: Home / Self Care | Payer: Self-pay | Source: Ambulatory Visit | Attending: Radiation Oncology | Admitting: Radiation Oncology

## 2011-09-16 DIAGNOSIS — C541 Malignant neoplasm of endometrium: Secondary | ICD-10-CM

## 2011-09-16 DIAGNOSIS — C549 Malignant neoplasm of corpus uteri, unspecified: Secondary | ICD-10-CM

## 2011-09-16 DIAGNOSIS — Z5111 Encounter for antineoplastic chemotherapy: Secondary | ICD-10-CM

## 2011-09-16 LAB — CBC WITH DIFFERENTIAL/PLATELET
BASO%: 0.1 % (ref 0.0–2.0)
Basophils Absolute: 0 10*3/uL (ref 0.0–0.1)
EOS%: 0.1 % (ref 0.0–7.0)
HCT: 32.7 % — ABNORMAL LOW (ref 34.8–46.6)
HGB: 10.9 g/dL — ABNORMAL LOW (ref 11.6–15.9)
LYMPH%: 5.2 % — ABNORMAL LOW (ref 14.0–49.7)
MCH: 32.3 pg (ref 25.1–34.0)
MCHC: 33.3 g/dL (ref 31.5–36.0)
NEUT%: 91.3 % — ABNORMAL HIGH (ref 38.4–76.8)
Platelets: 285 10*3/uL (ref 145–400)
lymph#: 0.7 10*3/uL — ABNORMAL LOW (ref 0.9–3.3)

## 2011-09-16 LAB — WHOLE BLOOD GLUCOSE: HRS PC: 1 Hours

## 2011-09-16 MED ORDER — SODIUM CHLORIDE 0.9 % IV SOLN
280.0000 mg | Freq: Once | INTRAVENOUS | Status: AC
  Administered 2011-09-16: 280 mg via INTRAVENOUS
  Filled 2011-09-16: qty 28

## 2011-09-16 MED ORDER — DOCETAXEL CHEMO INJECTION 160 MG/16ML: 60.0000 mg/m2 | Freq: Once | INTRAVENOUS | Status: DC

## 2011-09-16 MED ORDER — ONDANSETRON 16 MG/50ML IVPB (CHCC)
16.0000 mg | Freq: Once | INTRAVENOUS | Status: AC
  Administered 2011-09-16: 16 mg via INTRAVENOUS

## 2011-09-16 MED ORDER — SODIUM CHLORIDE 0.9 % IV SOLN
60.0000 mg/m2 | Freq: Once | INTRAVENOUS | Status: AC
  Administered 2011-09-16: 120 mg via INTRAVENOUS
  Filled 2011-09-16: qty 12

## 2011-09-16 MED ORDER — DEXAMETHASONE SODIUM PHOSPHATE 4 MG/ML IJ SOLN
20.0000 mg | Freq: Once | INTRAMUSCULAR | Status: AC
  Administered 2011-09-16: 20 mg via INTRAVENOUS

## 2011-09-16 NOTE — Telephone Encounter (Signed)
Spoke with patient about Dr. Trina Ao recommendations of a follow up with Dr. Nelly Rout in May 2013.  Appointment arranged for May 30th, 2013 at 12:45.  No questions or concerns voiced.

## 2011-09-16 NOTE — Telephone Encounter (Signed)
SPOKE WITH MS. Lisa Morales AND SHE STATED THAT SHE HAS NO URINARY SYMPTOMS AT ALL SHE FEELS MUCH BETTER.  TOLD HER THAT HER NEXT TREATMENT WOULD BE SCHEDULED WHEN SHE SAW DR. Darrold Span ON 09-30-11.

## 2011-09-16 NOTE — Progress Notes (Signed)
Here for routine radiation follow up post external beam radiation and HDR brachytherapy February 2013. Scheduled for second  chemotherapy today.Tolerating well except for peripheral neuropathy.

## 2011-09-16 NOTE — Progress Notes (Signed)
CC:   Lisa Morales, M.D. Laurette Schimke, MD  DIAGNOSIS:  Stage IIIA endometrial cancer.  INTERVAL SINCE RADIATION THERAPY:  1 month.  NARRATIVE:  Lisa Morales comes in today for routine followup.  She clinically seems to be doing well.  The patient denies any further problems with loose bowels or diarrhea.  She did have 1 additional episode of urinary tract infection, which according to the patient was treated with Cipro.  The patient denies any hematuria or dysuria at this time.  She denies any vaginal bleeding or rectal bleeding.  The patient is scheduled for her 5th of 6 planned cycles of chemotherapy later today.  PHYSICAL EXAMINATION:  The patient's temperature is 98.4, pulse 67, blood pressure is 126/75, weight is 243 pounds.  Examination of the neck and supraclavicular region reveals no evidence of adenopathy.  The axillary areas are free of adenopathy.  Examination of the lungs reveals them to be clear.  The heart has a regular rhythm and rate.  The abdomen is soft and nontender with normal bowel sounds.  A pelvic exam is not performed in light of patient's recent completion of treatment.  IMPRESSION/PLAN:  The patient is doing well 1 month out from her external beam and high-dose rate brachytherapy treatments.  As above, the patient will continue with additional chemotherapy.  I have recommended that the patient follow up with Dr. Nelly Rout in May of this year.  The patient will followup in Radiation Oncology in August of this year.  Today, the patient was given a vaginal dilator and instructions on its use.    ______________________________ Billie Lade, Ph.D., M.D. JDK/MEDQ  D:  09/16/2011  T:  09/16/2011  Job:  [id number]

## 2011-09-16 NOTE — Progress Notes (Signed)
Patient requested to take own insulin from home.  Per patients insulin instructions at home, received 2 units Novolin R.

## 2011-09-17 ENCOUNTER — Ambulatory Visit (HOSPITAL_BASED_OUTPATIENT_CLINIC_OR_DEPARTMENT_OTHER): Payer: Self-pay

## 2011-09-17 ENCOUNTER — Other Ambulatory Visit: Payer: Self-pay | Admitting: *Deleted

## 2011-09-17 VITALS — BP 133/77 | HR 67 | Temp 97.1°F

## 2011-09-17 DIAGNOSIS — C549 Malignant neoplasm of corpus uteri, unspecified: Secondary | ICD-10-CM

## 2011-09-17 DIAGNOSIS — C541 Malignant neoplasm of endometrium: Secondary | ICD-10-CM

## 2011-09-17 DIAGNOSIS — Z5189 Encounter for other specified aftercare: Secondary | ICD-10-CM

## 2011-09-17 MED ORDER — PEGFILGRASTIM INJECTION 6 MG/0.6ML
6.0000 mg | Freq: Once | SUBCUTANEOUS | Status: AC
  Administered 2011-09-17: 6 mg via SUBCUTANEOUS
  Filled 2011-09-17: qty 0.6

## 2011-09-17 MED ORDER — DEXAMETHASONE 4 MG PO TABS: ORAL_TABLET | ORAL | Status: DC

## 2011-09-30 ENCOUNTER — Ambulatory Visit (HOSPITAL_BASED_OUTPATIENT_CLINIC_OR_DEPARTMENT_OTHER): Payer: Self-pay | Admitting: Oncology

## 2011-09-30 ENCOUNTER — Telehealth: Payer: Self-pay | Admitting: Oncology

## 2011-09-30 ENCOUNTER — Other Ambulatory Visit: Payer: Self-pay

## 2011-09-30 ENCOUNTER — Encounter: Payer: Self-pay | Admitting: Oncology

## 2011-09-30 ENCOUNTER — Other Ambulatory Visit: Payer: Self-pay | Admitting: Lab

## 2011-09-30 VITALS — BP 112/69 | HR 78 | Temp 96.9°F | Ht 66.5 in | Wt 240.0 lb

## 2011-09-30 DIAGNOSIS — E1149 Type 2 diabetes mellitus with other diabetic neurological complication: Secondary | ICD-10-CM

## 2011-09-30 DIAGNOSIS — C549 Malignant neoplasm of corpus uteri, unspecified: Secondary | ICD-10-CM

## 2011-09-30 DIAGNOSIS — C541 Malignant neoplasm of endometrium: Secondary | ICD-10-CM

## 2011-09-30 DIAGNOSIS — F329 Major depressive disorder, single episode, unspecified: Secondary | ICD-10-CM

## 2011-09-30 DIAGNOSIS — G909 Disorder of the autonomic nervous system, unspecified: Secondary | ICD-10-CM

## 2011-09-30 LAB — CBC WITH DIFFERENTIAL/PLATELET
BASO%: 0.1 % (ref 0.0–2.0)
Basophils Absolute: 0 10*3/uL (ref 0.0–0.1)
Eosinophils Absolute: 0 10*3/uL (ref 0.0–0.5)
HCT: 33.5 % — ABNORMAL LOW (ref 34.8–46.6)
HGB: 11.2 g/dL — ABNORMAL LOW (ref 11.6–15.9)
MONO#: 0.4 10*3/uL (ref 0.1–0.9)
NEUT#: 12.2 10*3/uL — ABNORMAL HIGH (ref 1.5–6.5)
NEUT%: 91.3 % — ABNORMAL HIGH (ref 38.4–76.8)
Platelets: 153 10*3/uL (ref 145–400)
WBC: 13.4 10*3/uL — ABNORMAL HIGH (ref 3.9–10.3)
lymph#: 0.7 10*3/uL — ABNORMAL LOW (ref 0.9–3.3)

## 2011-09-30 LAB — COMPREHENSIVE METABOLIC PANEL
AST: 15 U/L (ref 0–37)
Albumin: 4.2 g/dL (ref 3.5–5.2)
BUN: 30 mg/dL — ABNORMAL HIGH (ref 6–23)
CO2: 27 mEq/L (ref 19–32)
Calcium: 9.8 mg/dL (ref 8.4–10.5)
Chloride: 104 mEq/L (ref 96–112)
Potassium: 4.5 mEq/L (ref 3.5–5.3)

## 2011-09-30 MED ORDER — DEXAMETHASONE 4 MG PO TABS: ORAL_TABLET | ORAL | Status: DC

## 2011-09-30 NOTE — Patient Instructions (Signed)
Dexamethasone for last chemo called in to [facility]. Begin 2 tablets (= 8 mg) twice daily with food starting 4-7 before chemo 4-8, for 3 days.

## 2011-09-30 NOTE — Progress Notes (Signed)
OFFICE PROGRESS NOTE Date of Visit 09-30-2011 Physicians: W.Brewster, J.Kinard, A.Tenny Craw, J.Kerr  INTERVAL HISTORY:  Patient is seen, alone for visit, in continuing attention to her IIIA grade 1 endometrial carcinoma, which has been treated to this point with initial laparoscopic hysterectomy/BSO/right pelvic node evaluation by Dr.Brewster 02-19-2011, initial 3 cycles of taxotere/carboplatin followed by external beam radiation and HDR thru 08-20-2011, and now receiving an additional 3 cycles of the taxotere/carboplatin. She had cycle 5 on 09-16-2011 with neulasta on 3-19. Cycle 6 is due 10-07-2011, again with neulasta. Note taxotere was chosen in preference to taxol due to preexisting peripheral neuropathy feet and hands, related to her diabetes. This neuropathy has not subjectively worsened with chemotherapy, tho it is still continuous and bothersome as it has been.  Patient has done well overall with most recent chemotherapy, with no nausea or vomiting, blood sugars ~ 70 - 120 fasting, no problems with peripheral IV access, no abdominal or pelvic pain, no fever or symptoms of infection, bowels moving well (x3 yesterday), good energy. She is back on Paxil, which has seemed helpful. Remainder of 10 point Review of Systems negative/ unchanged. Objective:  Vital signs in last 24 hours:  BP 112/69  Pulse 78  Temp(Src) 96.9 F (36.1 C) (Oral)  Ht 5' 6.5" (1.689 m)  Wt 240 lb (108.863 kg)  BMI 38.16 kg/m2 This weight is down 3 lbs. Alert, talkative, in good spirits, easily ambulatory.  HEENT:mucous membranes moist, pharynx normal without lesions. She does not have complete alopecia. PERRL. Not icteric LymphaticsCervical, supraclavicular, and axillary nodes normal.No inguinal adenopathy Resp: clear to auscultation bilaterally and normal percussion bilaterally Cardio: regular rate and rhythm GI: obese, soft, few bowel sounds, not tender, no appreciable organomegaly or masses. Laparoscopic incisions not  remarkable. Extremities: trace pedal edema left > right without cords or tenderness. Neuro: decreased sensation light touch hands and feet as previously. No central catheter. No problems at recent IV site LUE. Skin without rash or petechiae Lab Results:   Basename 09/30/11 0841  WBC 13.4*  HGB 11.2*  HCT 33.5*  PLT 153  ANC 12.2, this post neulasta 3-19 Hgb is up from 10.9 BMET CMET available after visit normal with exception of BUN 30, creat 1.15, AP 150 Studies/Results:  No results found.  Medications: I have reviewed the patient's current medications.Decadron will be called to patient's pharmacy, 4 mg bid x 3 days around taxotere (lower dose causing less hyperglycemia and no significant fluid retention)  Assessment/Plan: 1.IIIA grade 1 endometrial carcinoma: history and treatment as above. She will have cycle 6 carbo/taxotere on 4-8 as long as ANC >=1.5 and plt >=100k, and neulasta on 4-9. I will see her at least 4-29 with labs, and she should have CT before she sees Dr.Brewster on 5-30 2013 (no CT preop).She is scheduled back to Dr.Kinard in Aug. 2.Diabetes and diabetic peripheral neuropathy 3.hx UGI bleed from PUD 4.depression symptoms improved back on Paxil Patient was in agreement with plan as above. Lisa Brogden P, MD   09/30/2011, 9:24 AM

## 2011-09-30 NOTE — Telephone Encounter (Signed)
l/m with 4/8 appt time     aom

## 2011-10-07 ENCOUNTER — Ambulatory Visit (HOSPITAL_BASED_OUTPATIENT_CLINIC_OR_DEPARTMENT_OTHER): Payer: Self-pay

## 2011-10-07 ENCOUNTER — Other Ambulatory Visit (HOSPITAL_BASED_OUTPATIENT_CLINIC_OR_DEPARTMENT_OTHER): Payer: Self-pay | Admitting: Lab

## 2011-10-07 VITALS — BP 133/83 | HR 76 | Temp 98.4°F

## 2011-10-07 DIAGNOSIS — C541 Malignant neoplasm of endometrium: Secondary | ICD-10-CM

## 2011-10-07 DIAGNOSIS — C549 Malignant neoplasm of corpus uteri, unspecified: Secondary | ICD-10-CM

## 2011-10-07 DIAGNOSIS — Z5111 Encounter for antineoplastic chemotherapy: Secondary | ICD-10-CM

## 2011-10-07 DIAGNOSIS — E1149 Type 2 diabetes mellitus with other diabetic neurological complication: Secondary | ICD-10-CM

## 2011-10-07 LAB — CBC WITH DIFFERENTIAL/PLATELET
BASO%: 0.1 % (ref 0.0–2.0)
Basophils Absolute: 0 10*3/uL (ref 0.0–0.1)
EOS%: 0 % (ref 0.0–7.0)
HGB: 10.6 g/dL — ABNORMAL LOW (ref 11.6–15.9)
MCH: 32.6 pg (ref 25.1–34.0)
MCHC: 33.8 g/dL (ref 31.5–36.0)
RDW: 15.5 % — ABNORMAL HIGH (ref 11.2–14.5)
WBC: 17.2 10*3/uL — ABNORMAL HIGH (ref 3.9–10.3)
lymph#: 0.9 10*3/uL (ref 0.9–3.3)

## 2011-10-07 LAB — WHOLE BLOOD GLUCOSE: Glucose: 275 mg/dL — ABNORMAL HIGH (ref 70–100)

## 2011-10-07 MED ORDER — SODIUM CHLORIDE 0.9 % IV SOLN
360.0000 mg | Freq: Once | INTRAVENOUS | Status: AC
  Administered 2011-10-07: 360 mg via INTRAVENOUS
  Filled 2011-10-07: qty 36

## 2011-10-07 MED ORDER — INSULIN REGULAR HUMAN 100 UNIT/ML IJ SOLN
2.0000 [IU] | INTRAMUSCULAR | Status: DC
  Administered 2011-10-07: 2 [IU] via SUBCUTANEOUS
  Filled 2011-10-07: qty 0.02

## 2011-10-07 MED ORDER — DEXAMETHASONE SODIUM PHOSPHATE 4 MG/ML IJ SOLN
20.0000 mg | Freq: Once | INTRAMUSCULAR | Status: AC
  Administered 2011-10-07: 20 mg via INTRAVENOUS

## 2011-10-07 MED ORDER — ONDANSETRON 16 MG/50ML IVPB (CHCC)
16.0000 mg | Freq: Once | INTRAVENOUS | Status: AC
  Administered 2011-10-07: 16 mg via INTRAVENOUS

## 2011-10-07 MED ORDER — DOCETAXEL CHEMO INJECTION 160 MG/16ML
60.0000 mg/m2 | Freq: Once | INTRAVENOUS | Status: AC
  Administered 2011-10-07: 120 mg via INTRAVENOUS
  Filled 2011-10-07: qty 12

## 2011-10-08 ENCOUNTER — Ambulatory Visit (HOSPITAL_BASED_OUTPATIENT_CLINIC_OR_DEPARTMENT_OTHER): Payer: Self-pay

## 2011-10-08 ENCOUNTER — Ambulatory Visit: Payer: Self-pay

## 2011-10-08 VITALS — BP 121/73 | HR 80 | Temp 96.6°F

## 2011-10-08 DIAGNOSIS — C549 Malignant neoplasm of corpus uteri, unspecified: Secondary | ICD-10-CM

## 2011-10-08 DIAGNOSIS — C541 Malignant neoplasm of endometrium: Secondary | ICD-10-CM

## 2011-10-08 MED ORDER — PEGFILGRASTIM INJECTION 6 MG/0.6ML
6.0000 mg | Freq: Once | SUBCUTANEOUS | Status: AC
  Administered 2011-10-08: 6 mg via SUBCUTANEOUS
  Filled 2011-10-08: qty 0.6

## 2011-10-08 NOTE — Patient Instructions (Signed)
Pt in for injection without complications.  Pt discharged home ambulatory.  Pt to call with questions and concerns.

## 2011-10-25 ENCOUNTER — Encounter: Payer: Self-pay | Admitting: Oncology

## 2011-10-25 ENCOUNTER — Telehealth: Payer: Self-pay | Admitting: Oncology

## 2011-10-25 ENCOUNTER — Other Ambulatory Visit: Payer: Self-pay | Admitting: Oncology

## 2011-10-25 NOTE — Progress Notes (Signed)
Had first cycle of taxotere/carbo on 04-15-11 (documented in [facility]) and 5 additional cycles (documented in Epic). She has completed planned chemotherapy.

## 2011-10-25 NOTE — Telephone Encounter (Signed)
Per pof from 4/26 per dr ll cx chemo for 4/29

## 2011-10-27 ENCOUNTER — Other Ambulatory Visit: Payer: Self-pay | Admitting: Oncology

## 2011-10-28 ENCOUNTER — Telehealth: Payer: Self-pay | Admitting: Oncology

## 2011-10-28 ENCOUNTER — Ambulatory Visit (HOSPITAL_BASED_OUTPATIENT_CLINIC_OR_DEPARTMENT_OTHER): Payer: Self-pay | Admitting: Oncology

## 2011-10-28 ENCOUNTER — Encounter: Payer: Self-pay | Admitting: Oncology

## 2011-10-28 ENCOUNTER — Ambulatory Visit: Payer: Self-pay

## 2011-10-28 ENCOUNTER — Other Ambulatory Visit: Payer: Self-pay | Admitting: Lab

## 2011-10-28 VITALS — BP 113/71 | HR 72 | Temp 97.1°F | Ht 66.5 in | Wt 242.0 lb

## 2011-10-28 DIAGNOSIS — E1149 Type 2 diabetes mellitus with other diabetic neurological complication: Secondary | ICD-10-CM

## 2011-10-28 DIAGNOSIS — E1142 Type 2 diabetes mellitus with diabetic polyneuropathy: Secondary | ICD-10-CM

## 2011-10-28 DIAGNOSIS — C541 Malignant neoplasm of endometrium: Secondary | ICD-10-CM

## 2011-10-28 DIAGNOSIS — C549 Malignant neoplasm of corpus uteri, unspecified: Secondary | ICD-10-CM

## 2011-10-28 LAB — CBC WITH DIFFERENTIAL/PLATELET
Basophils Absolute: 0 10*3/uL (ref 0.0–0.1)
Eosinophils Absolute: 0 10*3/uL (ref 0.0–0.5)
HGB: 10.5 g/dL — ABNORMAL LOW (ref 11.6–15.9)
MCV: 100.5 fL (ref 79.5–101.0)
MONO%: 7 % (ref 0.0–14.0)
NEUT#: 7.6 10*3/uL — ABNORMAL HIGH (ref 1.5–6.5)
Platelets: 249 10*3/uL (ref 145–400)
RDW: 18 % — ABNORMAL HIGH (ref 11.2–14.5)

## 2011-10-28 LAB — COMPREHENSIVE METABOLIC PANEL
Albumin: 4.5 g/dL (ref 3.5–5.2)
CO2: 28 mEq/L (ref 19–32)
Calcium: 9.6 mg/dL (ref 8.4–10.5)
Glucose, Bld: 184 mg/dL — ABNORMAL HIGH (ref 70–99)
Sodium: 139 mEq/L (ref 135–145)
Total Bilirubin: 0.6 mg/dL (ref 0.3–1.2)
Total Protein: 6.6 g/dL (ref 6.0–8.3)

## 2011-10-28 NOTE — Telephone Encounter (Signed)
gv pt appt for may and aug2013.  scheduled pt for ct scan on 05/28 @ [facility]

## 2011-10-28 NOTE — Progress Notes (Signed)
OFFICE PROGRESS NOTE Date of Visit 10-28-2011 Physicians: W.Brewster, J.Kinard, A.Tenny Craw, J.Kerr INTERVAL HISTORY:  Patient is seen, alone for visit, in follow up of adjuvant chemotherapy for IIIA grade 1 endometrial carcinoma, which has been treated to this point with initial laparoscopic hysterectomy/BSO/right pelvic node evaluation by Dr.Brewster 02-19-2011, initial 3 cycles of taxotere/carboplatin followed by external beam radiation and HDR thru 08-20-2011, then additional 3 cycles of taxotere/carbo thru 10-07-2011. Taxotere was used in preference to taxol due to preexisting diabetic neuropathy. She did need gCSF support, but otherwise has tolerated the chemotherapy generally well. She is scheduled back to Dr.Brewster on 11-28-2011 and to Dr.Kinard on8-10-2011. She does not have PAC.  Patient had some increased fatigue after most recent chemotherapy on 4-8, such that she stayed out of work an extra day, but has resumed usual work schedule now. She has had very little nausea and does not tell any increase in her usual (significant) peripheral neuropathy. Blood sugars have been back in good range. She has had no fever or symptoms of infection, no increased shortness of breath, bowels ok, aching from neulasta resolved. She has had some itching and tearing eyes, which she thinks is allergy-related, but I have asked her to let me know if the tearing persists as this could be lacrimal duct irritation from the taxotere. She has seen a nontender white area on side of tongue.   Objective:  Vital signs in last 24 hours:  BP 113/71  Pulse 72  Temp(Src) 97.1 F (36.2 C) (Oral)  Ht 5' 6.5" (1.689 m)  Wt 242 lb (109.77 kg)  BMI 38.47 kg/m2 Weight is up 2 lbs. A little pale, not icteric, ambulatory slowly without assistance. She does not have complete alopecia   HEENT:mucous membranes moist, pharynx normal without lesions. PERRL. Sclerae and conjunctiva slightly injected with slight tearing L>R. 1 mm whitish  nodule left lateral tongue not tender. LymphaticsCervical, supraclavicular, and axillary nodes normal.No inguinal adenopathy Resp: clear to auscultation bilaterally and normal percussion bilaterally Cardio: regular rate and rhythm, clear heart sounds GI: soft, non-tender; bowel sounds normal; no masses,  no organomegaly. Obese, surgical incisions well-healed, nothing palpable Extremities: extremities normal, atraumatic, no cyanosis or edema Neuro:decreased sensation feet> hands as previousl Skin without rash or ecchymosis Breasts without dominant masses, skin changes or nipple discharge.Axillae benign Lab Results:   Basename 10/28/11 0841  WBC 9.2  HGB 10.5*  HCT 31.2*  PLT 249  ANC 7.6  BMET Full CMET available after visit with normal electrolytes, nonfasting glucose 184, BUN 52, creat 1.4 (up from 30 and 1.1) and remainder of CMET normal. We have contacted the patient to recommend increased hydration.  Studies/Results:  Patient had preop CXR but did not have CT. I have ordered CT AP to be done shortly  prior to Dr.Brewster's visit in May. Medications: I have reviewed the patient's current medications.  Assessment/Plan: 1.IIIA grade 1 endometrial carcinoma: post treatment as above, with baseline CT and follow up as above. I will see her back ~ August or sooner if needed. Repeat CBC and chemistries with CT in May. 2.Diabetes on insulin 3.peripheral neuropathy related to DM, no worse subjectively since chemotherapy 4.slight increased lacrimation, possibly allergic vs taxotere. She will let me know if this continues 5.per my consult note seen after this visit, overdue mammograms, which we will try to address 6.HTN, gout, hx GI bleed from PUD 7. obesity   Cassondra Stachowski P, MD   10/28/2011, 9:34 AM

## 2011-10-28 NOTE — Patient Instructions (Signed)
Call Dr.Kerriann Kamphuis if tearing continues and does not seem allergy related, as it may be from taxotere and anti-inflammatory drops/ ophthalmology might be helpful.

## 2011-10-29 ENCOUNTER — Other Ambulatory Visit: Payer: Self-pay | Admitting: Lab

## 2011-10-29 ENCOUNTER — Ambulatory Visit: Payer: Self-pay | Admitting: Oncology

## 2011-10-29 ENCOUNTER — Telehealth: Payer: Self-pay

## 2011-10-29 NOTE — Telephone Encounter (Signed)
Spoke with Ms. Lisa Morales and told her that her kidney function(creatine) a little higher (so not as good) yesterday.  She needs to push unsweetened fluids. Water would be great. Faxed a copy of the c-met to pcp Dr. Elby Showers per Dr.Livesay.

## 2011-10-31 NOTE — Progress Notes (Signed)
[facility] END OF TREATMENT   Name: Lisa Morales Date: 10/31/2011 MRN: [id number] DOB: 1951-05-28   TREATMENT DATES: 04-15-2011 thru 10-07-2011   REFERRING PHYSICIAN: Laurette Schimke, [facility]  DIAGNOSIS: endometrial cancer  STAGE AT START OF TREATMENT: IIIA grade 1   INTENT: curative   DRUGS OR REGIMENS GIVEN: taxotere/ carboplatin   MAJOR TOXICITIES: neutropenia   REASON TREATMENT STOPPED: completion of planned course   PERFORMANCE STATUS AT END: 1   ONGOING PROBLEMS: fatigue, mild anemia.   FOLLOW UP PLANS: CT AP prior to return visit to Dr.Brewster in May 2013; return to Drs Roselind Messier and Darrold Span in Aug 2013. Repeat blood counts and chemistries May 2013.

## 2011-11-01 ENCOUNTER — Telehealth: Payer: Self-pay | Admitting: Medical Oncology

## 2011-11-01 NOTE — Telephone Encounter (Signed)
Left message on [facility] mobile number to have pt call Monday with information on where she had her last mammogram and when .

## 2011-11-01 NOTE — Telephone Encounter (Signed)
Left message for pt to call re where last mammogram was so Dr Darrold Span can schedule

## 2011-11-05 ENCOUNTER — Other Ambulatory Visit: Payer: Self-pay | Admitting: Oncology

## 2011-11-05 ENCOUNTER — Telehealth: Payer: Self-pay

## 2011-11-05 DIAGNOSIS — Z1231 Encounter for screening mammogram for malignant neoplasm of breast: Secondary | ICD-10-CM

## 2011-11-05 NOTE — Telephone Encounter (Signed)
SPOKE WITH MS. Feng AND SHE IS FINE FOR DR. Darrold Span TO SET UP SCREENING BILAT MAMMOGRAMS AT [facility].  HER LAST MAMMOGRAM WAS IN 2009.  REPORT SENT TO DR. Darrold Span AND SENT TO MEDICAL RECORDS TO SCAN INTO PATIENT'S CHART. DR. Darrold Span SENT AN ELECTRONIC ORDER TO SCHEDULING TO SET UP APPT. PT. WOULD LIKE APPT. ON A Monday OR A Tuesday.  NOTIFIED ROSE IN SCHEDULING OF THIS PREFERENCE.

## 2011-11-06 ENCOUNTER — Telehealth: Payer: Self-pay | Admitting: Oncology

## 2011-11-06 NOTE — Telephone Encounter (Signed)
Called [facility], scheduled mammogram for 5/13th Monday @ 10:15am. Called pt, left message regarding mammogram appt date and also left telephone number of [facility] in case pt does not like the date and time.

## 2011-11-26 ENCOUNTER — Other Ambulatory Visit (HOSPITAL_BASED_OUTPATIENT_CLINIC_OR_DEPARTMENT_OTHER): Payer: Self-pay | Admitting: Lab

## 2011-11-26 ENCOUNTER — Ambulatory Visit (HOSPITAL_COMMUNITY)
Admission: RE | Admit: 2011-11-26 | Discharge: 2011-11-26 | Disposition: A | Discharge status: Home / Self Care | Payer: Self-pay | Source: Ambulatory Visit | Attending: Oncology | Admitting: Oncology

## 2011-11-26 DIAGNOSIS — C541 Malignant neoplasm of endometrium: Secondary | ICD-10-CM

## 2011-11-26 DIAGNOSIS — Z9079 Acquired absence of other genital organ(s): Secondary | ICD-10-CM | POA: Insufficient documentation

## 2011-11-26 DIAGNOSIS — N200 Calculus of kidney: Secondary | ICD-10-CM | POA: Insufficient documentation

## 2011-11-26 DIAGNOSIS — Z90710 Acquired absence of both cervix and uterus: Secondary | ICD-10-CM | POA: Insufficient documentation

## 2011-11-26 DIAGNOSIS — M51379 Other intervertebral disc degeneration, lumbosacral region without mention of lumbar back pain or lower extremity pain: Secondary | ICD-10-CM | POA: Insufficient documentation

## 2011-11-26 DIAGNOSIS — C549 Malignant neoplasm of corpus uteri, unspecified: Secondary | ICD-10-CM

## 2011-11-26 DIAGNOSIS — N2889 Other specified disorders of kidney and ureter: Secondary | ICD-10-CM | POA: Insufficient documentation

## 2011-11-26 DIAGNOSIS — R1909 Other intra-abdominal and pelvic swelling, mass and lump: Secondary | ICD-10-CM | POA: Insufficient documentation

## 2011-11-26 DIAGNOSIS — M5137 Other intervertebral disc degeneration, lumbosacral region: Secondary | ICD-10-CM | POA: Insufficient documentation

## 2011-11-26 LAB — CBC WITH DIFFERENTIAL/PLATELET
Basophils Absolute: 0 10*3/uL (ref 0.0–0.1)
EOS%: 2.6 % (ref 0.0–7.0)
Eosinophils Absolute: 0.2 10*3/uL (ref 0.0–0.5)
HCT: 33.6 % — ABNORMAL LOW (ref 34.8–46.6)
HGB: 11.3 g/dL — ABNORMAL LOW (ref 11.6–15.9)
MCH: 33.4 pg (ref 25.1–34.0)
MCV: 99.3 fL (ref 79.5–101.0)
MONO%: 8.6 % (ref 0.0–14.0)
NEUT#: 6.2 10*3/uL (ref 1.5–6.5)
NEUT%: 76.3 % (ref 38.4–76.8)
Platelets: 291 10*3/uL (ref 145–400)

## 2011-11-26 LAB — CMP (CANCER CENTER ONLY)
ALT(SGPT): 19 U/L (ref 10–47)
AST: 22 U/L (ref 11–38)
Albumin: 3.6 g/dL (ref 3.3–5.5)
Alkaline Phosphatase: 96 U/L — ABNORMAL HIGH (ref 26–84)
Calcium: 9.4 mg/dL (ref 8.0–10.3)
Chloride: 100 mEq/L (ref 98–108)
Potassium: 4.8 mEq/L — ABNORMAL HIGH (ref 3.3–4.7)
Sodium: 141 mEq/L (ref 128–145)
Total Protein: 7.6 g/dL (ref 6.4–8.1)

## 2011-11-26 MED ORDER — IOHEXOL 300 MG/ML  SOLN
125.0000 mL | Freq: Once | INTRAMUSCULAR | Status: AC | PRN
  Administered 2011-11-26: 125 mL via INTRAVENOUS

## 2011-11-28 ENCOUNTER — Ambulatory Visit: Payer: Self-pay | Attending: Gynecologic Oncology | Admitting: Gynecologic Oncology

## 2011-11-28 ENCOUNTER — Encounter: Payer: Self-pay | Admitting: Gynecologic Oncology

## 2011-11-28 VITALS — BP 118/70 | HR 80 | Temp 98.4°F | Resp 18 | Ht 66.0 in | Wt 244.7 lb

## 2011-11-28 DIAGNOSIS — Z9221 Personal history of antineoplastic chemotherapy: Secondary | ICD-10-CM | POA: Insufficient documentation

## 2011-11-28 DIAGNOSIS — Z9079 Acquired absence of other genital organ(s): Secondary | ICD-10-CM | POA: Insufficient documentation

## 2011-11-28 DIAGNOSIS — C541 Malignant neoplasm of endometrium: Secondary | ICD-10-CM

## 2011-11-28 DIAGNOSIS — Z923 Personal history of irradiation: Secondary | ICD-10-CM | POA: Insufficient documentation

## 2011-11-28 DIAGNOSIS — I10 Essential (primary) hypertension: Secondary | ICD-10-CM | POA: Insufficient documentation

## 2011-11-28 DIAGNOSIS — M109 Gout, unspecified: Secondary | ICD-10-CM | POA: Insufficient documentation

## 2011-11-28 DIAGNOSIS — C549 Malignant neoplasm of corpus uteri, unspecified: Secondary | ICD-10-CM | POA: Insufficient documentation

## 2011-11-28 DIAGNOSIS — Z90710 Acquired absence of both cervix and uterus: Secondary | ICD-10-CM | POA: Insufficient documentation

## 2011-11-28 DIAGNOSIS — E119 Type 2 diabetes mellitus without complications: Secondary | ICD-10-CM | POA: Insufficient documentation

## 2011-11-28 NOTE — Patient Instructions (Signed)
Completion CT 11/26/2011 without evidence of disease. F/U 11/2011 with Rad onc F/U 2013 with Dr. Darrold Span F/U 05/2012 with GYN ONC Patient counseled on S/S of recurrence Advised to lose weight.

## 2011-11-28 NOTE — Progress Notes (Signed)
CHIEF COMPLAINT:  Surveillance endometrial cancer  This is a 61 year old gravida 1, para 1,  last normal menstrual period approximately 15 years ago, vaginal  spotting was appreciated in June 2011. Subsequent endometrial biopsy  performed by Dr. Tenny Craw was notable for grade 1 endometrial cancer. On  February 19, 2011, she underwent a robotic-assisted laparoscopic  hysterectomy, bilateral salpingo-oophorectomy, right pelvic lymph node  sampling. 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 The tumor was  grade 1 with lymphovascular space invasion and invaded myometrial wall 2  cm where the myometrium was 2.5 cm. This correlates an 80% myometrial  wall thickness.Stage IIIA  Postoperatively she received sandwich therapy.  adjuvant chemotherapy  initial 3 cycles of taxotere/carboplatin followed by external beam radiation and HDR thru 08-20-2011, then additional 3 cycles of taxotere/carbo thru 10-07-2011.     PAST MEDICAL HISTORY:  1. Insulin-dependent diabetes mellitus.  2. Peptic ulcer disease.  3. Hypertension.  4. Gout.  5. Neuropathy.  6.  Stage IIIA uterine ca  PAST SURGICAL HISTORY:  1 robotic-assisted laparoscopic hysterectomy, bilateral salpingo-oophorectomy, right pelvic lymph node dissection.    REVIEW OF SYSTEMS: No nausea, vomiting, no fever or chills. Denies any  vaginal bleeding. No diarrhea or constipation. Denies hematuria, hematochezia, SOB, abdominal pain hematochezia, or flank pain.   PHYSICAL EXAMINATION: GENERAL: Well-developed female, in no acute  distress.   VITAL SIGNS: Ht 5\' 6"  (1.676 m)  Wt 244 lb 11.2 oz (110.995 kg)  BMI 39.50 kg/m2 HEART: RRR nl S1 S2 CHEST: Clear to auscultation.  ABDOMEN: Soft, nontender, Port sites, clean intact without erythema or hernia.   BACK:No CVAT LYMPH NODES:  No cervical supraclavicular or inguinal adenopathy. PELVIC EXAMINATION: Vaginal cuff intact, some  tenderness. No  discharge, no bleeding. No cul-de-sac masses.  RECTAL:  Good tone, no masses. EXT:  2+ edema bilaterally  ASSESSMENT/PLAN Ms. Houchen has a stage IIIA grade 1 endometrial cancer  with involvement of the right ovary and fallopian tubes, 80% myometrial  invasion and lymphovascular space invasion. She has completed sandwich therapy, chemotherapy punctuated by external beam therapy  followed by additional chemotherapy completed. Completion CT 11/26/2011 without evidence of disease. F/U 11/2011 with Rad onc F/U 2013 with Dr. Darrold Span F/U 05/2012 with GYN ONC Patient counseled on S/S of recurrence Advised to lose weight.

## 2011-11-29 NOTE — Progress Notes (Signed)
Please see the Nurse Progress Note in the MD Initial Consult Encounter for this patient. 

## 2011-11-29 NOTE — Progress Notes (Signed)
Encounter addended by: Amanda Pea, RN on: 11/29/2011  3:57 PM<BR>     Documentation filed: Charges VN

## 2011-12-12 ENCOUNTER — Telehealth: Payer: Self-pay | Admitting: Medical Oncology

## 2011-12-12 NOTE — Telephone Encounter (Signed)
Pt left a message that she will make appointment Monday for f/u of her urine. I called pt back to get more information but no answer so I left message.

## 2011-12-13 ENCOUNTER — Telehealth: Payer: Self-pay

## 2011-12-13 NOTE — Telephone Encounter (Signed)
Ms. Neglia called stating that she is having urinary burning , frequency, and urine is cloudy. She is also experiencing some flank pain.  Told Ms. Baik that she needed to call her PCP to be seen today.  Pt. Is not currently on active treatment.  Pt. Verbalized understanding.

## 2011-12-16 ENCOUNTER — Other Ambulatory Visit: Payer: Self-pay | Admitting: *Deleted

## 2011-12-16 DIAGNOSIS — C541 Malignant neoplasm of endometrium: Secondary | ICD-10-CM

## 2011-12-16 MED ORDER — PAROXETINE HCL 20 MG PO TABS: 20.0000 mg | ORAL_TABLET | Freq: Every day | ORAL | Status: AC

## 2012-01-20 ENCOUNTER — Encounter: Payer: Self-pay | Admitting: Oncology

## 2012-01-20 NOTE — Progress Notes (Signed)
Patient did call me today needing her account number,I did return her call but she told me that she has already found her account number and then she apologize to me for not calling me back when she found her account number.

## 2012-02-03 ENCOUNTER — Ambulatory Visit: Payer: Self-pay | Admitting: Radiation Oncology

## 2012-02-10 ENCOUNTER — Ambulatory Visit: Payer: Self-pay | Admitting: Radiation Oncology

## 2012-02-21 ENCOUNTER — Encounter (INDEPENDENT_AMBULATORY_CARE_PROVIDER_SITE_OTHER): Payer: Self-pay | Admitting: Ophthalmology

## 2012-02-23 ENCOUNTER — Encounter: Payer: Self-pay | Admitting: Radiation Oncology

## 2012-02-24 ENCOUNTER — Ambulatory Visit
Admission: RE | Admit: 2012-02-24 | Discharge: 2012-02-24 | Disposition: A | Discharge status: Home / Self Care | Payer: Self-pay | Source: Ambulatory Visit | Attending: Radiation Oncology | Admitting: Radiation Oncology

## 2012-02-24 ENCOUNTER — Encounter: Payer: Self-pay | Admitting: Radiation Oncology

## 2012-02-24 ENCOUNTER — Other Ambulatory Visit (HOSPITAL_BASED_OUTPATIENT_CLINIC_OR_DEPARTMENT_OTHER): Payer: Self-pay | Admitting: Lab

## 2012-02-24 ENCOUNTER — Ambulatory Visit (HOSPITAL_BASED_OUTPATIENT_CLINIC_OR_DEPARTMENT_OTHER): Payer: Self-pay | Admitting: Oncology

## 2012-02-24 ENCOUNTER — Other Ambulatory Visit (HOSPITAL_COMMUNITY)
Admission: RE | Admit: 2012-02-24 | Discharge: 2012-02-24 | Disposition: A | Discharge status: Home / Self Care | Payer: Self-pay | Source: Ambulatory Visit | Attending: Radiation Oncology | Admitting: Radiation Oncology

## 2012-02-24 ENCOUNTER — Encounter: Payer: Self-pay | Admitting: Oncology

## 2012-02-24 VITALS — BP 123/73 | HR 63 | Temp 97.7°F | Resp 22 | Ht 66.0 in | Wt 239.6 lb

## 2012-02-24 VITALS — BP 113/75 | HR 71 | Temp 97.6°F | Resp 18 | Wt 240.3 lb

## 2012-02-24 DIAGNOSIS — C541 Malignant neoplasm of endometrium: Secondary | ICD-10-CM

## 2012-02-24 DIAGNOSIS — C549 Malignant neoplasm of corpus uteri, unspecified: Secondary | ICD-10-CM

## 2012-02-24 DIAGNOSIS — E1149 Type 2 diabetes mellitus with other diabetic neurological complication: Secondary | ICD-10-CM

## 2012-02-24 DIAGNOSIS — Z01419 Encounter for gynecological examination (general) (routine) without abnormal findings: Secondary | ICD-10-CM | POA: Insufficient documentation

## 2012-02-24 DIAGNOSIS — E1142 Type 2 diabetes mellitus with diabetic polyneuropathy: Secondary | ICD-10-CM

## 2012-02-24 LAB — CBC WITH DIFFERENTIAL/PLATELET
BASO%: 0.4 % (ref 0.0–2.0)
Basophils Absolute: 0 10*3/uL (ref 0.0–0.1)
EOS%: 2.5 % (ref 0.0–7.0)
MCH: 30.6 pg (ref 25.1–34.0)
MCHC: 33.1 g/dL (ref 31.5–36.0)
MCV: 92.3 fL (ref 79.5–101.0)
MONO%: 7.3 % (ref 0.0–14.0)
RBC: 3.92 10*6/uL (ref 3.70–5.45)
RDW: 16.1 % — ABNORMAL HIGH (ref 11.2–14.5)

## 2012-02-24 LAB — COMPREHENSIVE METABOLIC PANEL (CC13)
AST: 15 U/L (ref 5–34)
Albumin: 3.8 g/dL (ref 3.5–5.0)
Alkaline Phosphatase: 97 U/L (ref 40–150)
BUN: 42 mg/dL — ABNORMAL HIGH (ref 7.0–26.0)
Potassium: 4.7 mEq/L (ref 3.5–5.1)
Sodium: 138 mEq/L (ref 136–145)
Total Protein: 7 g/dL (ref 6.4–8.3)

## 2012-02-24 NOTE — Progress Notes (Signed)
Radiation Oncology         [phone] ________________________________  Name: Lisa Morales MRN: [id number]  Date: 02/24/2012  DOB: [birth year]  Follow-Up Visit Note  CC: Provider Not In System  Livesay, Juanita Craver, MD  Diagnosis:   Stage IIIa endometrial cancer  Interval Since Last Radiation:  6 months  Narrative:  The patient returns today for routine follow-up.  She is doing well at this time except for diabetic neuropathy issues. She denies any vaginal bleeding or pelvic pain. Patient denies any hematuria or rectal bleeding. She did undergo CT scan of the abdomen and pelvis in the spring showing no evidence of recurrence.                              ALLERGIES:  is allergic to adhesive; aspirin; and naproxen.  Meds: Current Outpatient Prescriptions  Medication Sig Dispense Refill  . allopurinol (ZYLOPRIM) 300 MG tablet Take 300 mg by mouth daily.        Marland Kitchen ascorbic acid (VITAMIN C) 1000 MG tablet Take 1,000 mg by mouth 2 (two) times daily.        Marland Kitchen atenolol (TENORMIN) 100 MG tablet Take 100 mg by mouth daily.        . B Complex Vitamins (VITAMIN B COMPLEX) TABS Take 1 tablet by mouth daily.        . diphenhydramine-acetaminophen (TYLENOL PM) 25-500 MG TABS Take 1 tablet by mouth at bedtime as needed.        . fish oil-omega-3 fatty acids 1000 MG capsule Take 1 g by mouth daily.        Marland Kitchen gabapentin (NEURONTIN) 300 MG capsule Take 300 mg by mouth 3 (three) times daily.        Marland Kitchen GINSENG PO Take 1 tablet by mouth daily.        . hydrochlorothiazide (HYDRODIURIL) 12.5 MG tablet Take 12.5 mg by mouth daily.        . insulin NPH (HUMULIN N,NOVOLIN N) 100 UNIT/ML injection Inject 50 Units into the skin 2 (two) times daily. MIXES WITH REGULAR IN THE SAME SYRINGE.      Marland Kitchen insulin regular (HUMULIN R,NOVOLIN R) 100 units/mL injection Inject 42 Units into the skin 2 (two) times daily before a meal. MIXES WITH HUMULIN NPH IN SAME SYRINGE.      Marland Kitchen loratadine (CLARITIN) 10 MG tablet Take 10 mg by  mouth daily.        . Multiple Vitamin (MULTIVITAMIN PO) Take 1 tablet by mouth daily.        Marland Kitchen PARoxetine (PAXIL) 20 MG tablet Take 1 tablet (20 mg total) by mouth daily.  30 tablet  2  . capsicum (ZOSTRIX DIABETIC FOOT PAIN) 0.075 % topical cream Apply 1 application topically 3 (three) times daily.        . prochlorperazine (COMPAZINE) 10 MG tablet Take 10 mg by mouth every 6 (six) hours as needed. FOR NAUSEA         Physical Findings: The patient is in no acute distress. Patient is alert and oriented.  weight is 240 lb 4.8 oz (108.999 kg). Her oral temperature is 97.6 F (36.4 C). Her blood pressure is 113/75 and her pulse is 71. Her respiration is 18. Marland Kitchen  No palpable cervical supraclavicular or axillary adenopathy. The lungs clear to auscultation. The heart has regular rhythm and rate. The abdomen is soft nontender with normal bowel sounds. There is no inguinal  adenopathy appreciated. On pelvic examination the external genitalia are unremarkable. A speculum exam is performed. There are no mucosal lesions noted. A Pap smear was obtained of the proximal vagina. On bimanual and rectovaginal examination there no pelvic masses appreciated.  Lab Findings: Lab Results  Component Value Date   WBC 8.4 02/24/2012   HGB 12.0 02/24/2012   HCT 36.2 02/24/2012   MCV 92.3 02/24/2012   PLT 276 02/24/2012    @LASTCHEM @  Radiographic Findings: No results found.  Impression:  No evidence of recurrence on exam today, Pap smear pending  Plan:  Routine followup in 6 months.  _____________________________________  -----------------------------------  Billie Lade, PhD, MD

## 2012-02-24 NOTE — Patient Instructions (Signed)
Keep appointments as scheduled. Dr Darrold Span will see you back as needed since Drs Nelly Rout and Roselind Messier will follow you for now.

## 2012-02-24 NOTE — Progress Notes (Signed)
OFFICE PROGRESS NOTE   02/24/2012   Physicians:W.Brewster, J.Kinard, A.Ross, J.Kerr, Elby Showers (PCP)   INTERVAL HISTORY:  Patient is seen, alone for visit, in scheduled follow up of her history of IIIA grade 1 endometrial carcinoma, with chemotherapy completed 10-07-11.  History is of initial laparoscopic hysterectomy/BSO/right pelvic node evaluation by Dr Nelly Rout 02-19-2011, then initial 3  Cycles of taxotere + carboplatin followed by external beam radiation then HDR, then additional 3 cycles of the same chemotherapy thru April 2013. Taxotere was used in preference to taxol due to preexisting significant diabetic neuropathy. She did need gCSF support. Last CT AP done 11-26-11 showed no apparent disease. She saw Dr Nelly Rout shortly after that CT and sees her again 05-12-12; she is to see Dr Roselind Messier later today. She does not have PAC.  Patient has been doing well overall since she was seen last, going about all regular activities including work. She has had retinal problem OS which is being followed at [facility] and is expected to resolve without intervention. She has had no change in peripheral neuropathy in feet, controlled with gabapentin. She is not symptomatic from nephrolithiasis seen on CT. She has not been following diabetic diet closely. She has had no fever or symptoms of infection, no abdominal or pelvic pain, bowels are moving regularly, no bladder symptoms, no bleeding, no LE swelling, no shortness of breath or other respiratory symptoms. Remainder of 10 point Review of Systems negative.  Objective:  Vital signs in last 24 hours:  BP 123/73  Pulse 63  Temp 97.7 F (36.5 C) (Oral)  Resp 22  Ht 5\' 6"  (1.676 m)  Wt 239 lb 9.6 oz (108.682 kg)  BMI 38.67 kg/m2 Weight is down 2.5 lbs from last visit to me. Easily ambulatory, looks comfortable, very pleasant as always. Respirations not labored RA. Hair has grown back.  HEENT:PERRLA, sclera clear, anicteric, oropharynx clear, no lesions  and neck supple with midline trachea. No apparent excessive lacrimation LymphaticsCervical, supraclavicular, and axillary nodes normal.No inguinal adenopathy appreciated. Resp: clear to auscultation bilaterally and normal percussion bilaterally Cardio: regular rate and rhythm GI: soft, non-tender; bowel sounds normal; no masses,  no organomegaly. Obese, nothing palpable. Laparoscopic scars not remarkable. Extremities: extremities normal, atraumatic, no cyanosis or edema. Fingernails not remarkable Neuro: decreased sensation light touch feet bilaterally as baseline Breast:normal without suspicious masses, skin or nipple changes or axillary nodes Skin without bruises or rash  Lab Results:  Results for orders placed in visit on 02/24/12  CBC WITH DIFFERENTIAL      Component Value Range   WBC 8.4  3.9 - 10.3 10e3/uL   NEUT# 6.3  1.5 - 6.5 10e3/uL   HGB 12.0  11.6 - 15.9 g/dL   HCT 16.1  09.6 - 04.5 %   Platelets 276  145 - 400 10e3/uL   MCV 92.3  79.5 - 101.0 fL   MCH 30.6  25.1 - 34.0 pg   MCHC 33.1  31.5 - 36.0 g/dL   RBC 4.09  8.11 - 9.14 10e6/uL   RDW 16.1 (*) 11.2 - 14.5 %   lymph# 1.3  0.9 - 3.3 10e3/uL   MONO# 0.6  0.1 - 0.9 10e3/uL   Eosinophils Absolute 0.2  0.0 - 0.5 10e3/uL   Basophils Absolute 0.0  0.0 - 0.1 10e3/uL   NEUT% 74.8  38.4 - 76.8 %   LYMPH% 15.0  14.0 - 49.7 %   MONO% 7.3  0.0 - 14.0 %   EOS% 2.5  0.0 - 7.0 %  BASO% 0.4  0.0 - 2.0 %  COMPREHENSIVE METABOLIC PANEL (CC13)      Component Value Range   Sodium 138  136 - 145 mEq/L   Potassium 4.7  3.5 - 5.1 mEq/L   Chloride 106  98 - 107 mEq/L   CO2 23  22 - 29 mEq/L   Glucose 165 (*) 70 - 99 mg/dl   BUN 29.5 (*) 7.0 - 62.1 mg/dL   Creatinine 1.3 (*) 0.6 - 1.1 mg/dL   Total Bilirubin 3.08  0.20 - 1.20 mg/dL   Alkaline Phosphatase 97  40 - 150 U/L   AST 15  5 - 34 U/L   Total Protein 7.0  6.4 - 8.3 g/dL   ALT 15  0 - 55 U/L   Albumin 3.8  3.5 - 5.0 g/dL   Calcium 9.7  8.4 - 65.7 mg/dL    This  creatinine down slightly from last labs Studies/Results: Mammograms 11-12-11 at [facility] CT AP 11-26-11 as noted Medications: I have reviewed the patient's current medications. She continues Paxil, which has been very helpful for her. PCP or other of her physicians can give refills.  Assessment/Plan: 1. IIIA grade 1 endometrial carcinoma: history as above. Counts have recovered back to baseline and no residual problems from chemotherapy. She will be followed by Drs Nelly Rout and Kinard on regular basis and I will see her back prn. 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 Recent retinal bleed per patient's history 8.HTN, gout, hx PUD with bleed  Patient was in agreement with plan above and understands that I am glad to see her at any time if she or her other physicians feel that I can be of help.   Reece Packer, MD   02/24/2012, 4:47 PM

## 2012-02-24 NOTE — Progress Notes (Signed)
Patient presents to the clinic today unaccompanied for a follow up appointment with Dr. Roselind Messier. Patient is alert and oriented to person, place, and time. No distress noted.Steady gait noted. Pleasant affect noted. Patient reports hand and foot pain related to effects of diabetic neuropathy for which gabapentin relieves. Patient denies nausea, vomiting, headache, and dizziness. Patient denies diarrhea. Patient denies seeing blood in urine or stool. Patient denies pain associated with defecation or urination. Patient reports her urine is clear yellow without odor. Patient reports on average she gets up twice during the night to void however, patient reports that she drinks lots of urine during day. Patient denies urinary urgency or frequency. Patient denies vaginal discharge. Reported all findings to Dr. Roselind Messier.  Labs drawn today Patient seen today by [facility]

## 2012-03-05 ENCOUNTER — Telehealth: Payer: Self-pay

## 2012-03-05 NOTE — Telephone Encounter (Signed)
Patient informed of pap smear results completed on 02/25/12  is negative of intraepithelial lesions or malignancy.

## 2012-04-13 NOTE — Progress Notes (Signed)
This encounter was created in error - please disregard.

## 2012-05-21 ENCOUNTER — Ambulatory Visit: Payer: Self-pay | Admitting: Gynecologic Oncology

## 2012-06-17 ENCOUNTER — Other Ambulatory Visit: Payer: Self-pay | Admitting: *Deleted

## 2012-06-17 NOTE — Telephone Encounter (Signed)
Rec'd refill request for Paxil 20mg  tabs. Patient no longer active under care and does not have any return appts here at [facility].  Called [facility], they will forward to primary MD, Dr Elby Showers. Left voicemail for patient to call us back if any issues in this matter.

## 2012-06-19 ENCOUNTER — Other Ambulatory Visit: Payer: Self-pay

## 2012-06-19 NOTE — Telephone Encounter (Signed)
Left message with Annice Pih acknowledging that she called and told her that Ms. Pisani's pharmacy said that Dr. Clent Ridges called  in prescription and Lisa Morales has picked up prescription.

## 2012-07-21 ENCOUNTER — Ambulatory Visit: Payer: Self-pay | Attending: Gynecologic Oncology | Admitting: Gynecologic Oncology

## 2012-07-21 ENCOUNTER — Encounter: Payer: Self-pay | Admitting: Gynecologic Oncology

## 2012-07-21 VITALS — BP 144/74 | HR 64 | Temp 97.9°F | Resp 16 | Ht 66.38 in | Wt 244.1 lb

## 2012-07-21 DIAGNOSIS — I10 Essential (primary) hypertension: Secondary | ICD-10-CM | POA: Insufficient documentation

## 2012-07-21 DIAGNOSIS — K319 Disease of stomach and duodenum, unspecified: Secondary | ICD-10-CM | POA: Insufficient documentation

## 2012-07-21 DIAGNOSIS — E119 Type 2 diabetes mellitus without complications: Secondary | ICD-10-CM | POA: Insufficient documentation

## 2012-07-21 DIAGNOSIS — C549 Malignant neoplasm of corpus uteri, unspecified: Secondary | ICD-10-CM | POA: Insufficient documentation

## 2012-07-21 DIAGNOSIS — C55 Malignant neoplasm of uterus, part unspecified: Secondary | ICD-10-CM | POA: Insufficient documentation

## 2012-07-21 DIAGNOSIS — G589 Mononeuropathy, unspecified: Secondary | ICD-10-CM | POA: Insufficient documentation

## 2012-07-21 DIAGNOSIS — N904 Leukoplakia of vulva: Secondary | ICD-10-CM | POA: Insufficient documentation

## 2012-07-21 DIAGNOSIS — C541 Malignant neoplasm of endometrium: Secondary | ICD-10-CM

## 2012-07-21 DIAGNOSIS — M109 Gout, unspecified: Secondary | ICD-10-CM | POA: Insufficient documentation

## 2012-07-21 NOTE — Progress Notes (Signed)
CHIEF COMPLAINT:  Surveillance endometrial cancer  This is a 62 year old gravida 1, para 1, last normal menstrual period approximately 15 years ago, vaginal spotting was appreciated in June 2011. Subsequent endometrial biopsy performed by Dr. Tenny Craw was notable for grade 1 endometrial cancer. On February 19, 2011, she underwent a robotic-assisted laparoscopic hysterectomy, bilateral salpingo-oophorectomy, right pelvic lymph node sampling. 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 The tumor was grade 1 with lymphovascular space invasion and invaded myometrial wall 2 cm where the myometrium was 2.5 cm. This correlates an 80% myometrial wall thickness.Stage IIIA  Postoperatively she received sandwich therapy.  adjuvant chemotherapy  initial 3 cycles of taxotere/carboplatin followed by external beam radiation and HDR thru 08-20-2011, then additional 3 cycles of taxotere/carbo thru 10-07-2011.     PAST MEDICAL HISTORY:  1. Insulin-dependent diabetes mellitus.  2. Peptic ulcer disease.  3. Hypertension.  4. Gout.  5. Neuropathy.  6.  Stage IIIA uterine ca  PAST SURGICAL HISTORY:  1 robotic-assisted laparoscopic hysterectomy, bilateral salpingo-oophorectomy, right pelvic lymph node dissection.    REVIEW OF SYSTEMS: No nausea, vomiting, no fever or chills. Denies any vaginal bleeding, no pruritis. No diarrhea or constipation. Denies hematuria, hematochezia, SOB, abdominal pain hematochezia, or flank pain.   PHYSICAL EXAMINATION: GENERAL: Well-developed female, in no acute  distress.   VITAL SIGNS: BP 144/74  Pulse 64  Temp 97.9 F (36.6 C) (Oral)  Resp 16  Ht 5' 6.38" (1.686 m)  Wt 244 lb 1.6 oz (110.723 kg)  BMI 38.95 kg/m2 HEART: RRR nl S1 S2 CHEST: Clear to auscultation.  ABDOMEN: Soft, nontender, Port sites, clean intact without erythema mass or hernia.   BACK:No CVAT LYMPH NODES:  No cervical supraclavicular or inguinal  adenopathy. 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  There was prepped with Betadine and a biopsy collected,  Monsel solution was placed to facilitate hemostasis Vaginal cuff intact, some tenderness. No discharge, no bleeding. No cul-de-sac masses.  RECTAL:  Good tone, no masses. EXT:  2+ edema bilaterally  ASSESSMENT/PLAN Ms. Maenza has a stage IIIA grade 1 endometrial cancer with involvement of the right ovary and fallopian tubes, 80% myometrial invasion and lymphovascular space invasion. 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  Discussion was held with her regarding management options based on various degrees of dysplasia

## 2012-07-21 NOTE — Patient Instructions (Signed)
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  Discussion was held with her regarding management options based on various degrees of dysplasia

## 2012-07-23 ENCOUNTER — Telehealth: Payer: Self-pay | Admitting: Gynecologic Oncology

## 2012-07-23 NOTE — Telephone Encounter (Signed)
Message left with biopsy results: no evidence of dysplasia or malignancy.  Instructed to call the office for any questions or concerns and to follow up with Dr. Nelly Rout as scheduled.

## 2012-08-24 ENCOUNTER — Ambulatory Visit: Payer: Self-pay | Admitting: Radiation Oncology

## 2012-08-29 IMAGING — CR DG CHEST 2V
2 series · 2 of 2 positions shown · non-contrast
Comparison: 06/23/2009

CLINICAL DATA: Uterine carcinoma, preop

CHEST - 2 VIEW

[w chest pa]
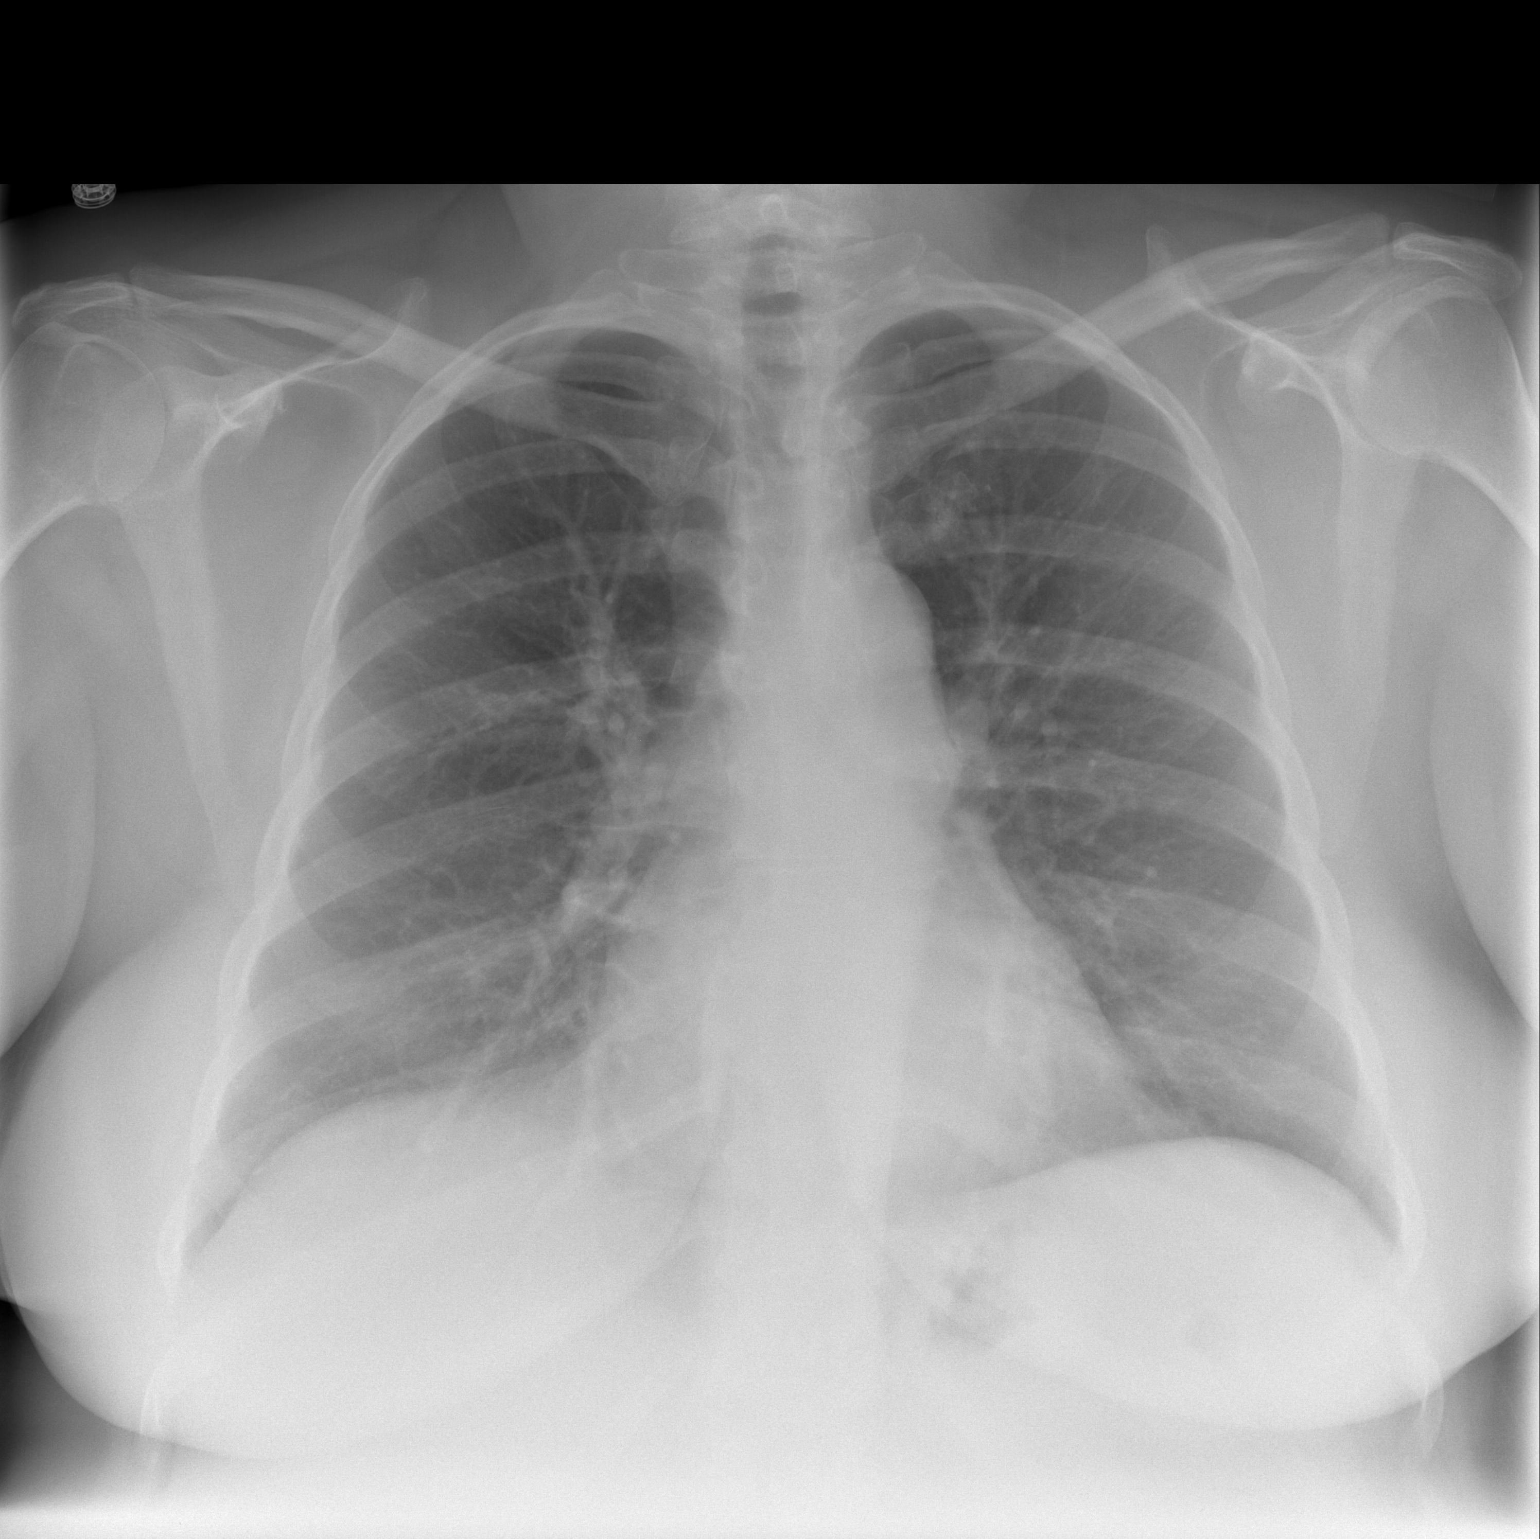

[w chest lat]
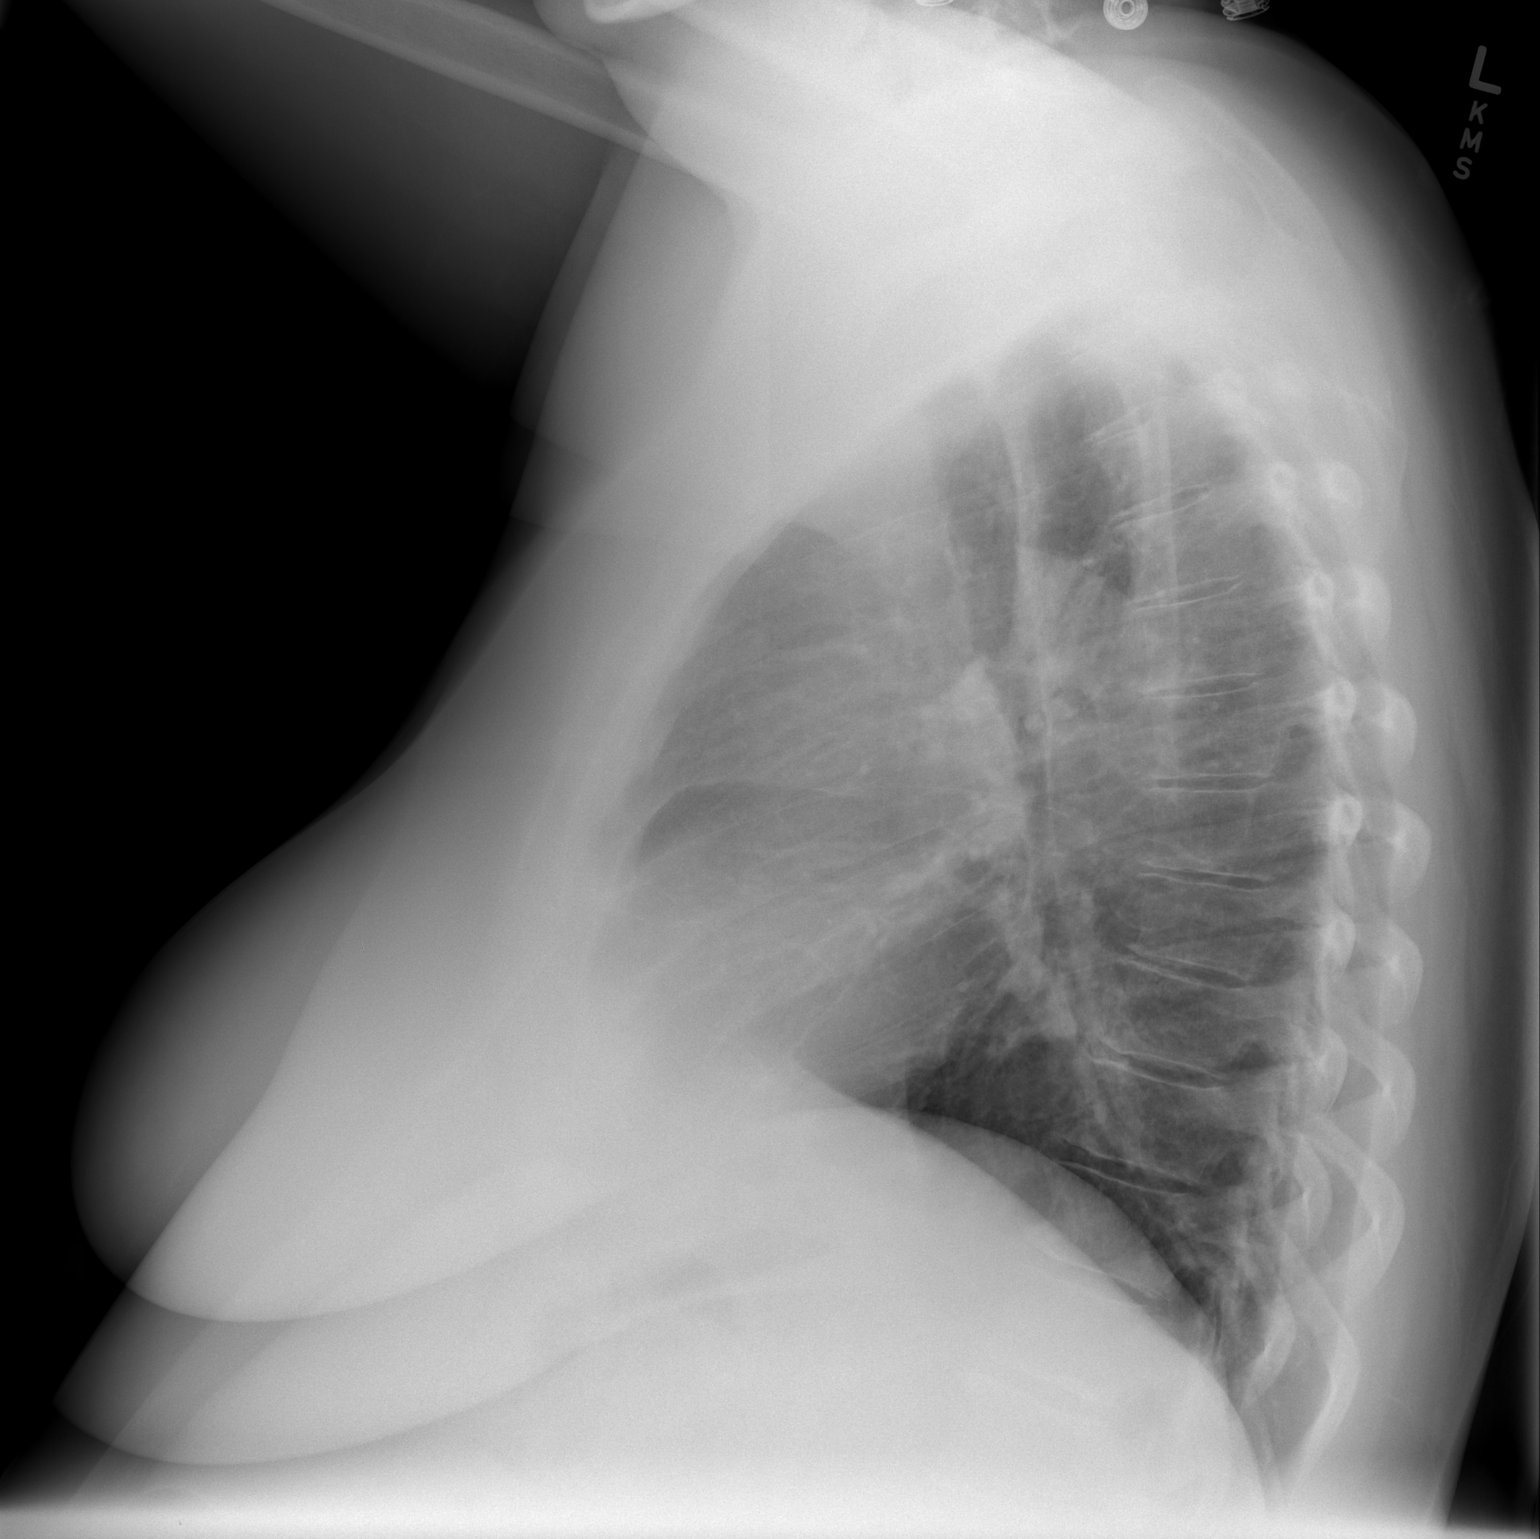

[2 of 2 positions shown; findings below may reference images not displayed]

FINDINGS: Lungs clear.  Heart size and pulmonary vascularity
normal.  No effusion.  Visualized bones unremarkable.
IMPRESSION: No acute disease

## 2012-10-26 ENCOUNTER — Ambulatory Visit: Payer: Self-pay | Admitting: Radiation Oncology

## 2012-10-29 ENCOUNTER — Encounter: Payer: Self-pay | Admitting: Radiation Oncology

## 2012-10-29 ENCOUNTER — Ambulatory Visit
Admission: RE | Admit: 2012-10-29 | Discharge: 2012-10-29 | Disposition: A | Discharge status: Home / Self Care | Payer: BC Managed Care – PPO | Source: Ambulatory Visit | Attending: Radiation Oncology | Admitting: Radiation Oncology

## 2012-10-29 ENCOUNTER — Other Ambulatory Visit (HOSPITAL_COMMUNITY)
Admission: RE | Admit: 2012-10-29 | Discharge: 2012-10-29 | Disposition: A | Discharge status: Home / Self Care | Payer: BC Managed Care – PPO | Source: Ambulatory Visit | Attending: Radiation Oncology | Admitting: Radiation Oncology

## 2012-10-29 VITALS — BP 137/72 | HR 66 | Temp 97.0°F | Resp 16 | Wt 249.1 lb

## 2012-10-29 DIAGNOSIS — Z01419 Encounter for gynecological examination (general) (routine) without abnormal findings: Secondary | ICD-10-CM | POA: Insufficient documentation

## 2012-10-29 DIAGNOSIS — C549 Malignant neoplasm of corpus uteri, unspecified: Secondary | ICD-10-CM

## 2012-10-29 DIAGNOSIS — Z1151 Encounter for screening for human papillomavirus (HPV): Secondary | ICD-10-CM | POA: Insufficient documentation

## 2012-10-29 NOTE — Progress Notes (Signed)
Radiation Oncology         [phone] ________________________________  Name: Lisa Morales MRN: [id number]  Date: 10/29/2012  DOB: 29-May-1951  Follow-Up Visit Note  CC: Provider Not In System  Reece Packer, MD  Diagnosis:   Stage IIIa endometrial cancer  Interval Since Last Radiation:  One year and 2 months. Patient received external beam and intracavitary brachytherapy treatments   Narrative:  The patient returns today for routine follow-up.  She is doing well and without complaints except for problems with diabetic neuropathy in her feet. She has been inconsistent and using her vaginal dilator and I stressed the importance of this issue.   Patient was seen by Dr. Nelly Rout in January of this year. The patient was noted to have some leukoplakia of the left labia minora. A biopsy of this area revealed squamous epithelium with hyperkeratosis, no evidence of malignancy.  Patient denies any pelvic pain vaginal bleeding urination difficulties or bowel complaints.  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                           ALLERGIES:  is allergic to adhesive; aspirin; and naproxen.  Meds: Current Outpatient Prescriptions  Medication Sig Dispense Refill  . allopurinol (ZYLOPRIM) 300 MG tablet Take 300 mg by mouth daily.        Marland Kitchen ascorbic acid (VITAMIN C) 1000 MG tablet Take 1,000 mg by mouth 2 (two) times daily.        Marland Kitchen atenolol (TENORMIN) 100 MG tablet Take 100 mg by mouth daily.        . diphenhydramine-acetaminophen (TYLENOL PM) 25-500 MG TABS Take 1 tablet by mouth at bedtime as needed.        . gabapentin (NEURONTIN) 300 MG capsule Take 300 mg by mouth 3 (three) times daily.        . hydrochlorothiazide (HYDRODIURIL) 12.5 MG tablet Take 12.5 mg by mouth daily.        . insulin NPH (HUMULIN N,NOVOLIN N) 100 UNIT/ML injection Inject 50 Units into the skin 2 (two) times daily. MIXES WITH REGULAR IN THE SAME SYRINGE.      Marland Kitchen insulin regular (HUMULIN  R,NOVOLIN R) 100 units/mL injection Inject 42 Units into the skin 2 (two) times daily before a meal. MIXES WITH HUMULIN NPH IN SAME SYRINGE.      . Multiple Vitamin (MULTIVITAMIN PO) Take 1 tablet by mouth daily.        Marland Kitchen PARoxetine (PAXIL) 20 MG tablet Take 1 tablet (20 mg total) by mouth daily.  30 tablet  2  . B Complex Vitamins (VITAMIN B COMPLEX) TABS Take 1 tablet by mouth daily.        . capsicum (ZOSTRIX DIABETIC FOOT PAIN) 0.075 % topical cream Apply 1 application topically 3 (three) times daily.        . fish oil-omega-3 fatty acids 1000 MG capsule Take 1 g by mouth daily.        Marland Kitchen GINSENG PO Take 1 tablet by mouth daily.        Marland Kitchen loratadine (CLARITIN) 10 MG tablet Take 10 mg by mouth daily.        . prochlorperazine (COMPAZINE) 10 MG tablet Take 10 mg by mouth every 6 (six) hours as needed. FOR NAUSEA        No current facility-administered medications for this encounter.    Physical Findings: The patient is in no  acute distress. Patient is alert and oriented.  weight is 249 lb 1.6 oz (112.991 kg). Her oral temperature is 97 F (36.1 C). Her blood pressure is 137/72 and her pulse is 66. Her respiration is 16 and oxygen saturation is 100%. .  No palpable cervical or supraclavicular adenopathy. The lungs are clear to auscultation. The heart has regular rhythm and rate. Examination of the abdomen reveals to be soft and nontender with normal bowel sounds. The inguinal areas are free of adenopathy. On pelvic examination there is some erythema noted to the inner portion of the labia majora. there no obvious lesions noted.  A speculum exam is performed. There are no mucosal lesions noted in the vaginal vault. A Pap smear was obtained of the proximal vagina. On bimanual and rectovaginal examination there no pelvic masses appreciated.  Lab Findings: Lab Results  Component Value Date   WBC 8.4 02/24/2012   HGB 12.0 02/24/2012   HCT 36.2 02/24/2012   MCV 92.3 02/24/2012   PLT 276 02/24/2012     @LASTCHEM @  Radiographic Findings: No results found.  Impression:  No evidence of recurrence on clinical exam today, Pap smear pending  Plan:  Routine followup in 6 months. In the interim the patient will be seen by gynecologic oncology.  _____________________________________  -----------------------------------  Billie Lade, PhD, MD

## 2012-10-29 NOTE — Progress Notes (Addendum)
Patient presents to the clinic today unaccompanied for follow up with Dr. Roselind Messier. Patient alert and oriented to person, place, and time. No distress noted. Steady gait noted. Pleasant affect noted. Patient reports continued pain in feet related to effects of neuropathy. Patient goes on to explain that gabapentin helps to relieve this pain. Patient denies vaginal itching, odor or discharge. Patient reports only occasional diarrhea associated with foods she eats. Patient denies burning with urination or blood in the urine. Patient denies nausea, vomiting, headache, or dizziness. Patient reports that she feels great and continues to work. Patient denies using vaginal dilator as directed but, confirms she still has it and knows how to use it. Reported all findings to Dr. Roselind Messier.

## 2012-11-09 ENCOUNTER — Telehealth: Payer: Self-pay | Admitting: *Deleted

## 2012-11-09 NOTE — Telephone Encounter (Signed)
Patient called requesting her pap results, informed her once Dr.Kinard sees report, he will give to his nurse Colen Darling, and she would then give you a call, will let her know you had called for results 9:17 AM

## 2013-05-06 ENCOUNTER — Ambulatory Visit: Payer: BC Managed Care – PPO | Admitting: Radiation Oncology

## 2013-05-13 ENCOUNTER — Encounter: Payer: Self-pay | Admitting: Radiation Oncology

## 2013-05-13 ENCOUNTER — Other Ambulatory Visit (HOSPITAL_COMMUNITY)
Admission: RE | Admit: 2013-05-13 | Discharge: 2013-05-13 | Disposition: A | Discharge status: Home / Self Care | Payer: BC Managed Care – PPO | Source: Ambulatory Visit | Attending: Radiation Oncology | Admitting: Radiation Oncology

## 2013-05-13 ENCOUNTER — Ambulatory Visit
Admission: RE | Admit: 2013-05-13 | Discharge: 2013-05-13 | Disposition: A | Discharge status: Home / Self Care | Payer: BC Managed Care – PPO | Source: Ambulatory Visit | Attending: Radiation Oncology | Admitting: Radiation Oncology

## 2013-05-13 VITALS — BP 138/82 | HR 72 | Temp 97.6°F | Ht 66.0 in | Wt 252.0 lb

## 2013-05-13 DIAGNOSIS — Z01419 Encounter for gynecological examination (general) (routine) without abnormal findings: Secondary | ICD-10-CM | POA: Insufficient documentation

## 2013-05-13 DIAGNOSIS — C541 Malignant neoplasm of endometrium: Secondary | ICD-10-CM

## 2013-05-13 NOTE — Progress Notes (Signed)
Radiation Oncology         [phone] ________________________________  Name: Lisa Morales MRN: [id number]  Date: 05/13/2013  DOB: 07-Jun-1951  Follow-Up Visit Note  CC: Provider Not In System  Livesay, Juanita Craver, MD  Diagnosis:   Stage IIIa endometrial cancer  Interval Since Last Radiation:  One year and 8 months   Narrative:  The patient returns today for routine follow-up.  She is doing well except for complaints of chronic pain in her feet related to diabetic neuropathy. She denies any vaginal bleeding or itching. She denies any hematuria or rectal bleeding. She has been inconsistent with using her vaginal dilator.                              ALLERGIES:  is allergic to adhesive; aspirin; and naproxen.  Meds: Current Outpatient Prescriptions  Medication Sig Dispense Refill  . allopurinol (ZYLOPRIM) 300 MG tablet Take 300 mg by mouth daily.        Marland Kitchen ascorbic acid (VITAMIN C) 1000 MG tablet Take 1,000 mg by mouth 2 (two) times daily.        Marland Kitchen atenolol (TENORMIN) 100 MG tablet Take 100 mg by mouth daily.        . diphenhydramine-acetaminophen (TYLENOL PM) 25-500 MG TABS Take 1 tablet by mouth at bedtime as needed.        . fish oil-omega-3 fatty acids 1000 MG capsule Take 1 g by mouth daily.        Marland Kitchen gabapentin (NEURONTIN) 300 MG capsule Take 300 mg by mouth 3 (three) times daily.        . Insulin Regular Human (HUMULIN R U-500, CONCENTRATED, [place]) Inject 20 Units into the skin 2 (two) times daily before a meal. Takes before breakfast and dinner.      Marland Kitchen lisinopril (PRINIVIL,ZESTRIL) 5 MG tablet Take 5 mg by mouth daily.      Marland Kitchen loratadine (CLARITIN) 10 MG tablet Take 10 mg by mouth daily.        . Multiple Vitamin (MULTIVITAMIN PO) Take 1 tablet by mouth daily.        Marland Kitchen PARoxetine (PAXIL) 20 MG tablet Take 1 tablet (20 mg total) by mouth daily.  30 tablet  2  . traMADol (ULTRAM) 50 MG tablet Take 50 mg by mouth every 6 (six) hours as needed.      . capsicum (ZOSTRIX DIABETIC FOOT  PAIN) 0.075 % topical cream Apply 1 application topically 3 (three) times daily.        . prochlorperazine (COMPAZINE) 10 MG tablet Take 10 mg by mouth every 6 (six) hours as needed. FOR NAUSEA        No current facility-administered medications for this encounter.    Physical Findings: The patient is in no acute distress. Patient is alert and oriented.  height is 5\' 6"  (1.676 m) and weight is 252 lb (114.306 kg). Her temperature is 97.6 F (36.4 C). Her blood pressure is 138/82 and her pulse is 72. Marland Kitchen  No palpable supraclavicular or axillary adenopathy. The lungs are clear to auscultation. The heart has regular rhythm and rate. The abdomen is soft and nontender with normal bowel sounds. The inguinal areas are free of adenopathy. On pelvic examination the patient is noted to have some leukoplakia along the left posterior labia minora.  A speculum exam is performed. There is radiation changes noted in the proximal vagina. No mucosal lesions are noted. A  Pap smear was obtained of the proximal vagina. On bimanual and rectovaginal examination there no pelvic masses appreciated.  Lab Findings: Lab Results  Component Value Date   WBC 8.4 02/24/2012   HGB 12.0 02/24/2012   HCT 36.2 02/24/2012   MCV 92.3 02/24/2012   PLT 276 02/24/2012      Radiographic Findings: No results found.  Impression:  No evidence of recurrence on clinical exam today, Pap smear pending.  Plan:  Routine followup in 6 months. The patient will be seen by gynecologic oncology in 3 months. I am unsure whether the patient may require an additional biopsy of the leukoplakia noted along the left labia.   _____________________________________  -----------------------------------  -----------------------------------  Billie Lade, PhD, MD

## 2013-05-13 NOTE — Progress Notes (Signed)
Freddy Jaksch here for follow up after treatment for endometrial cancer.  She denies pain but does have chronic pain in her feet from diabetic neuropathy.  She denies diarrhea, nausea, vaginal discharge or rectal bleeding.

## 2013-05-20 ENCOUNTER — Telehealth: Payer: Self-pay | Admitting: Oncology

## 2013-05-20 NOTE — Telephone Encounter (Signed)
Called Lisa Morales and let her know that the results from her pap smear were good per Dr. Roselind Messier.

## 2013-06-12 IMAGING — CT CT ABD-PELV W/ CM
2 of 7 series · 16 of 46 positions shown, 18 images · IV contrast ([ID] [id number])
Comparison: None

CLINICAL DATA: Restaging endometrial carcinoma

CT ABDOMEN AND PELVIS WITH CONTRAST
TECHNIQUE: Multidetector CT imaging of the abdomen and pelvis was
performed following the standard protocol during bolus
administration of intravenous contrast.
Contrast: 125mL OMNIPAQUE IOHEXOL 300 MG/ML  SOLN

[Series 3: rtn a/p with · axial · 0.94mm/px · z∈[-664,-299]mm · 13 of 85 slices shown, 15 images]
[im 6/85  soft-tissue]
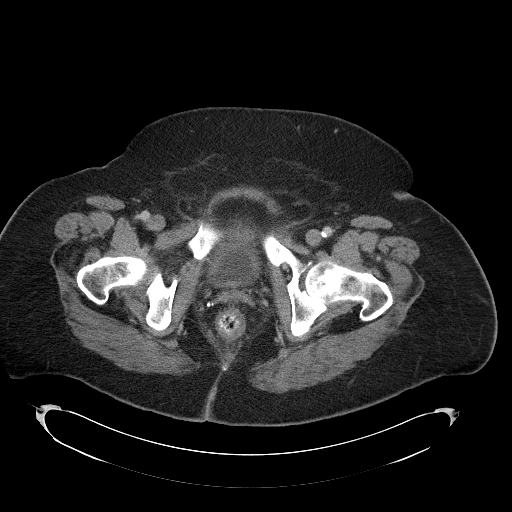
[im 6/85  bone]
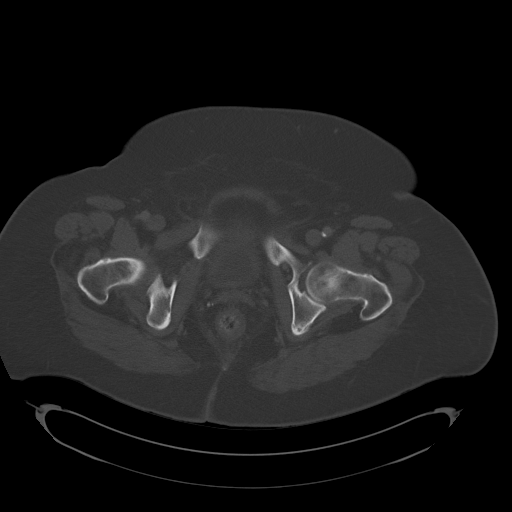
[im 11/85  soft-tissue]
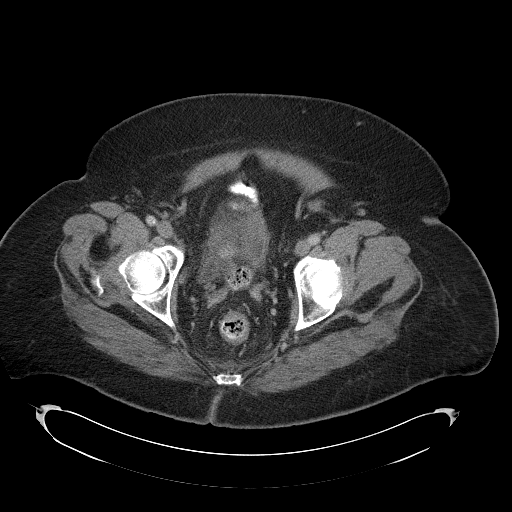
[im 16/85  soft-tissue]
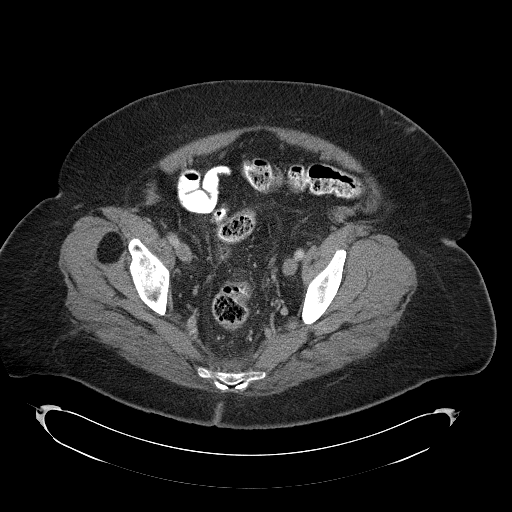
[im 27/85  soft-tissue]
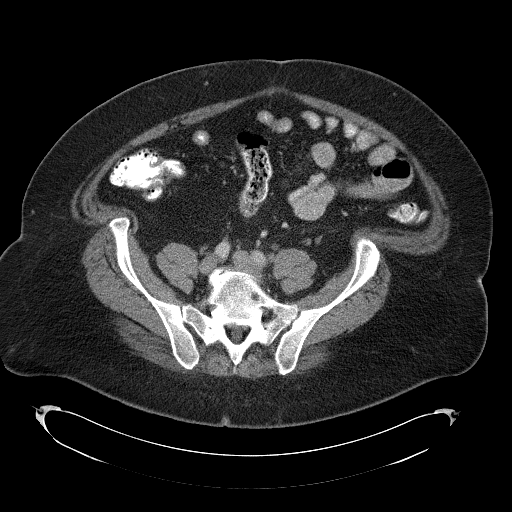
[im 32/85  soft-tissue]
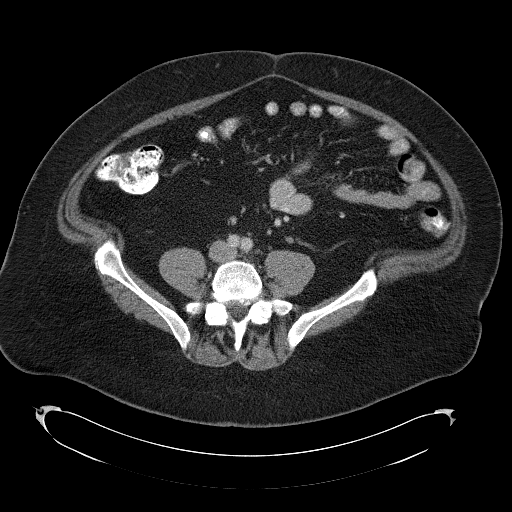
[im 37/85  soft-tissue]
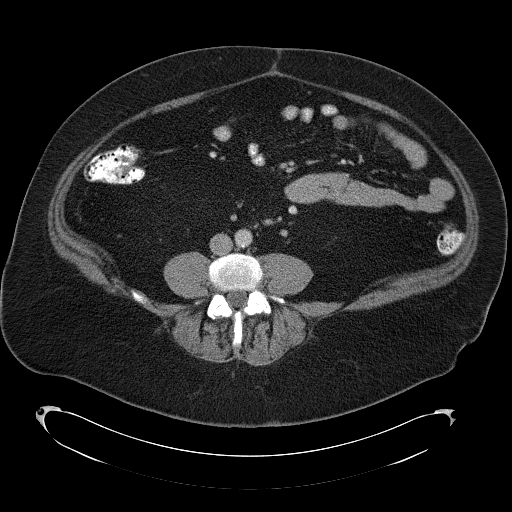
[im 43/85  soft-tissue]
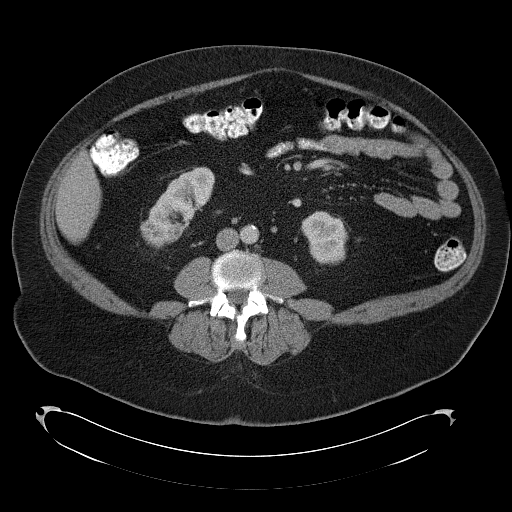
[im 48/85  soft-tissue]
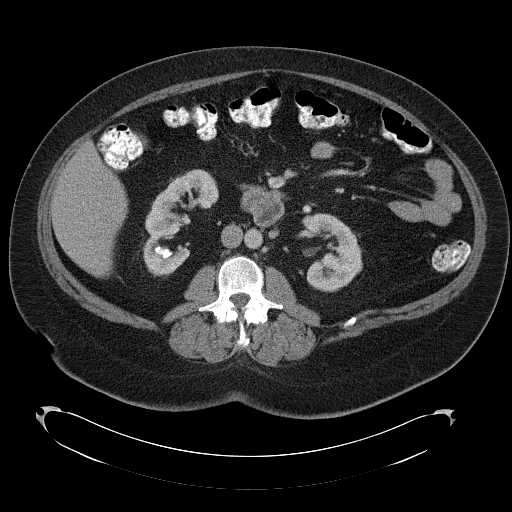
[im 53/85  soft-tissue]
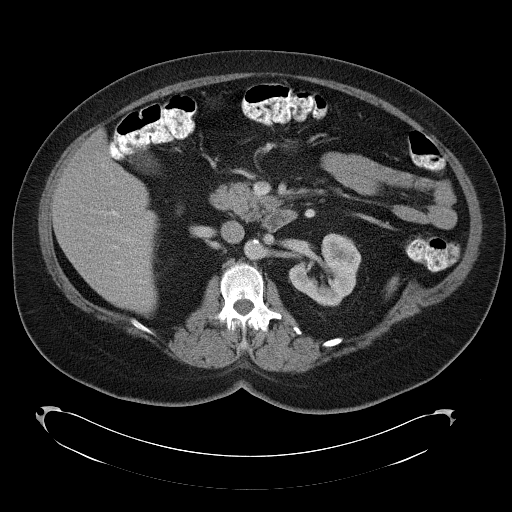
[im 53/85  bone]
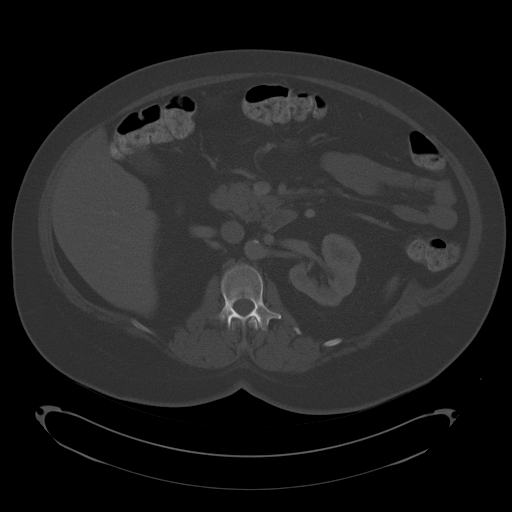
[im 58/85  soft-tissue]
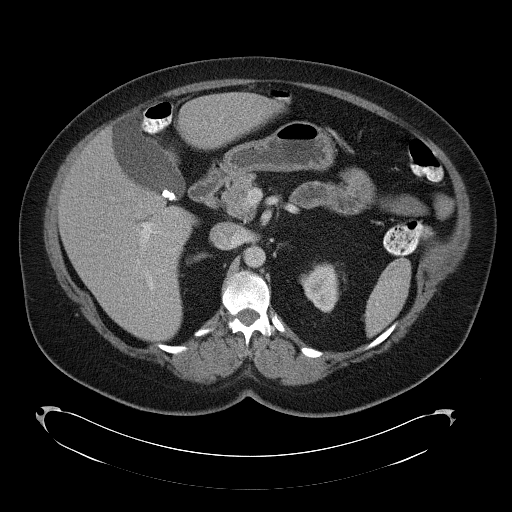
[im 69/85  soft-tissue]
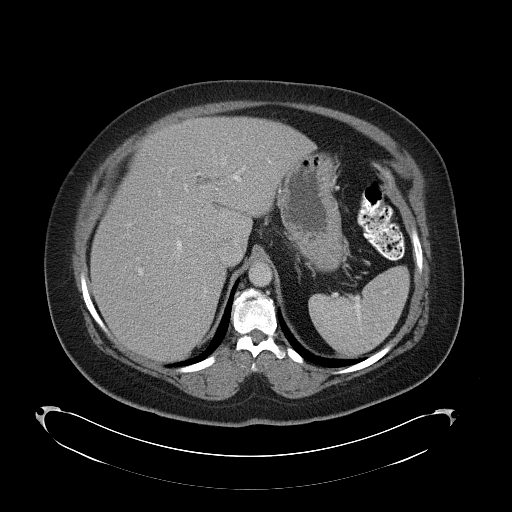
[im 74/85  soft-tissue]
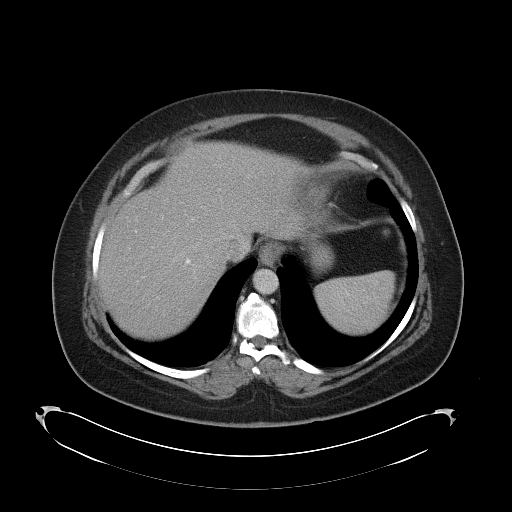
[im 79/85  soft-tissue]
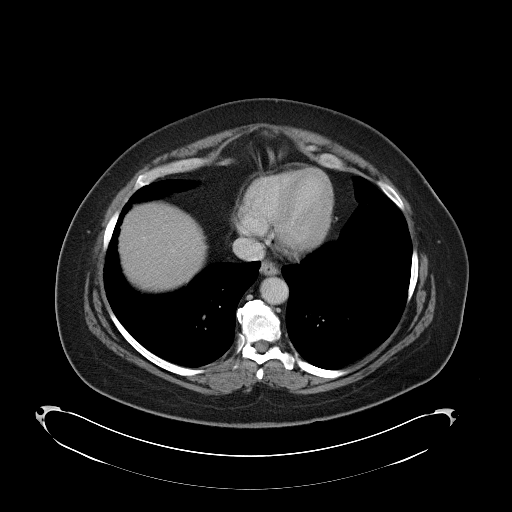

[Series 602: <mpr thick range> · coronal · 0.94mm/px · 3 of 106 slices shown]
[im 36/106  soft-tissue]
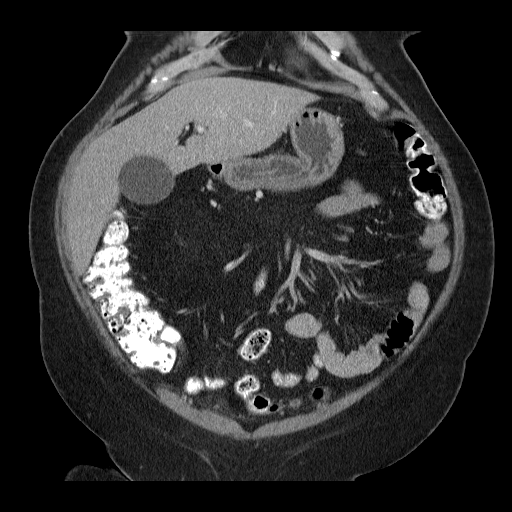
[im 47/106  soft-tissue]
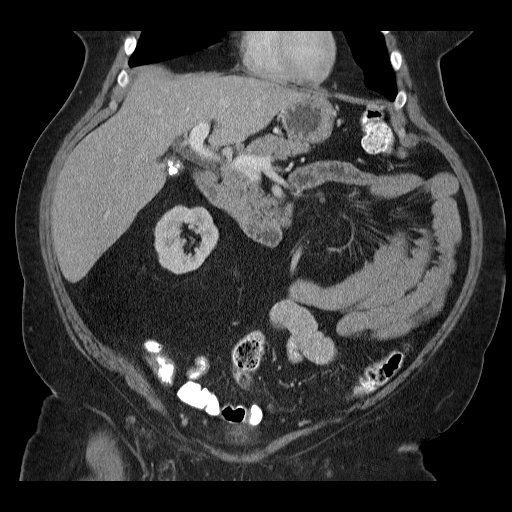
[im 59/106  soft-tissue]
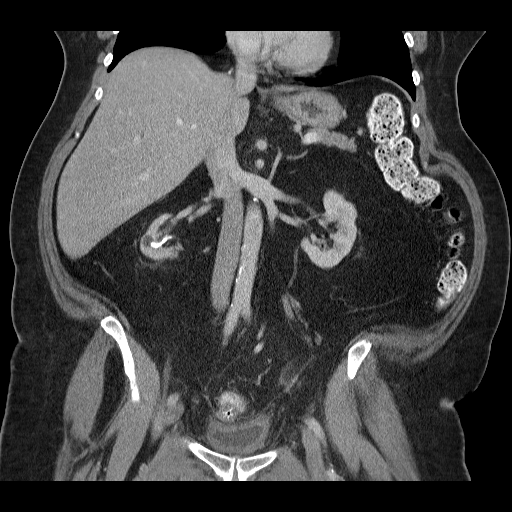

[16 of 46 positions shown; findings below may reference images not displayed]

FINDINGS: The lung bases appear clear.

No pericardial or pleural effusion.  No focal liver abnormalities
identified.  There are multiple calcified stones within the
dependent portion of the gallbladder.  These measure up to 0.7 cm.
No biliary ductal dilatation.  The pancreas appears within normal
limits.  The spleen is normal.

Both of the adrenal glands are normal.  The left kidney is normal.
Dilated pelvocaliceal diverticulum is identified within the
inferior pole of the right kidney.  There is a staghorn calculus
which fills the inferior pole of the right kidney.  This measures
approximately 2.2 cm, image 63.  No right-sided hydronephrosis or
hydroureter identified.

Urinary bladder appears normal.

Prior hysterectomy and oophorectomy.

No enlarged lymph nodes within the upper abdomen.  No pelvic or
inguinal adenopathy.  There is no ascites within the upper abdomen.
Stranding is identified within the peritoneal fat of the lower
abdomen and pelvis. This is nonspecific  and may be related to
postop change.  No discrete nodule or mass identified.

The stomach and the small bowel loops are unremarkable.  The
appendix is identified and appears normal.  The colon appears
unremarkable.  Review of the visualized osseous structures is
significant for degenerative disc disease at the L4-5 level.
IMPRESSION: 1.  Postoperative change compatible with hysterectomy and BSO.
2.  No acute findings and no specific features to suggest
metastatic disease.
3.  Stranding mass within the peritoneal fat in the lower abdomen
and pelvis is nonspecific and may related to postoperative change.
Attention on follow-up imaging is advised.
3.  Staghorn calculus within the inferior pole collecting system of
the right kidney.

## 2013-08-12 ENCOUNTER — Ambulatory Visit: Payer: BC Managed Care – PPO | Admitting: Gynecologic Oncology

## 2013-08-12 ENCOUNTER — Encounter: Payer: Self-pay | Admitting: Gynecologic Oncology

## 2013-08-12 ENCOUNTER — Ambulatory Visit: Payer: No Typology Code available for payment source | Attending: Gynecologic Oncology | Admitting: Gynecologic Oncology

## 2013-08-12 VITALS — BP 142/81 | HR 70 | Temp 98.4°F | Resp 16 | Ht 66.38 in | Wt 244.5 lb

## 2013-08-12 DIAGNOSIS — N760 Acute vaginitis: Secondary | ICD-10-CM | POA: Insufficient documentation

## 2013-08-12 DIAGNOSIS — C541 Malignant neoplasm of endometrium: Secondary | ICD-10-CM

## 2013-08-12 DIAGNOSIS — Z9079 Acquired absence of other genital organ(s): Secondary | ICD-10-CM | POA: Insufficient documentation

## 2013-08-12 DIAGNOSIS — Z9221 Personal history of antineoplastic chemotherapy: Secondary | ICD-10-CM | POA: Insufficient documentation

## 2013-08-12 DIAGNOSIS — Z923 Personal history of irradiation: Secondary | ICD-10-CM | POA: Insufficient documentation

## 2013-08-12 DIAGNOSIS — C549 Malignant neoplasm of corpus uteri, unspecified: Secondary | ICD-10-CM | POA: Insufficient documentation

## 2013-08-12 DIAGNOSIS — E119 Type 2 diabetes mellitus without complications: Secondary | ICD-10-CM | POA: Insufficient documentation

## 2013-08-12 DIAGNOSIS — Z90710 Acquired absence of both cervix and uterus: Secondary | ICD-10-CM | POA: Insufficient documentation

## 2013-08-12 DIAGNOSIS — I10 Essential (primary) hypertension: Secondary | ICD-10-CM | POA: Insufficient documentation

## 2013-08-12 DIAGNOSIS — Z794 Long term (current) use of insulin: Secondary | ICD-10-CM | POA: Insufficient documentation

## 2013-08-12 NOTE — Progress Notes (Signed)
CHIEF COMPLAINT:  Surveillance endometrial cancer  This is a 63 year old gravida 1, para 1, last normal menstrual period approximately 15 years ago, vaginal spotting was appreciated in June 2011. Subsequent endometrial biopsy performed by Dr. Harrington Challenger was notable for grade 1 endometrial cancer. On February 19, 2011, she underwent a robotic-assisted laparoscopic hysterectomy, bilateral salpingo-oophorectomy, right pelvic lymph node sampling. 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 The tumor was grade 1 with lymphovascular space invasion and invaded myometrial wall 2 cm where the myometrium was 2.5 cm. This correlates an 80% myometrial wall thickness.Stage IIIA  Postoperatively she received sandwich therapy.  adjuvant chemotherapy  initial 3 cycles of taxotere/carboplatin followed by external beam radiation and HDR thru 08-20-2011, then additional 3 cycles of taxotere/carbo thru 10-07-2011.     PAST MEDICAL HISTORY:  1. Insulin-dependent diabetes mellitus.  2. Peptic ulcer disease.  3. Hypertension.  4. Gout.  5. Neuropathy.  6.  Stage IIIA uterine ca  PAST SURGICAL HISTORY:  1 robotic-assisted laparoscopic hysterectomy, bilateral salpingo-oophorectomy, right pelvic lymph node dissection.    REVIEW OF SYSTEMS: No nausea, vomiting, no fever or chills. Denies any vaginal bleeding, no pruritis. No diarrhea or constipation. Denies hematuria, hematochezia, SOB, abdominal pain hematochezia, or flank pain.   PHYSICAL EXAMINATION: GENERAL: Well-developed female, in no acute distress.   VITAL SIGNS: BP 142/81  Pulse 70  Temp(Src) 98.4 F (36.9 C) (Oral)  Resp 16  Ht 5' 6.38" (1.686 m)  Wt 244 lb 8 oz (110.904 kg)  BMI 39.02 kg/m2 HEART: RRR nl S1 S2 CHEST: Clear to auscultation.  ABDOMEN: Soft, nontender, Port sites, clean intact without erythema mass or hernia.   BACK:No CVAT LYMPH NODES:  No cervical supraclavicular or inguinal  adenopathy. 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  50mm lesion noted also on the right labia minora.  The left sided lesion was was prepped with Betadine and a biopsy collected,  Hemostasis facilitated with silver nitrate.  Vaginal cuff intact, some tenderness. No discharge, no bleeding. No cul-de-sac masses.  RECTAL:  Good tone, no masses. EXT:  No clubbing cyanosis or edema bilaterally  ASSESSMENT/PLAN Ms. Narducci has a stage IIIA grade 1 endometrial cancer with involvement of the right ovary and fallopian tubes, 80% myometrial invasion and lymphovascular space invasion. She has completed sandwich therapy, chemotherapy punctuated by external beam therapy followed by additional chemotherapy completed.10/07/2011 No evidence of disease F/U 10/2013 with Rad onc F/U 01/2014 with Dr. 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  Lesion appears larger than prior with a kissing lesion.  A representative biopsy was collected.   Discussion was held with her regarding management options based on various degrees of dysplasia

## 2013-08-12 NOTE — Patient Instructions (Signed)
We will contact you with the results of your biopsy from today.  See Dr. Sondra Come in May 2015 and Dr. Harrington Challenger in August 2015.

## 2013-08-17 ENCOUNTER — Telehealth: Payer: Self-pay | Admitting: Gynecologic Oncology

## 2013-08-17 NOTE — Telephone Encounter (Signed)
Patient notified of biopsy results.  No concerns voiced.  She is advised to call for any questions or concerns and to follow up with Dr. Jacinto Reap in Nov 2015.

## 2013-08-20 ENCOUNTER — Telehealth: Payer: Self-pay | Admitting: Gynecologic Oncology

## 2013-08-20 NOTE — Telephone Encounter (Signed)
Advised of Dr. Leone Brand recommendations for a follow up appt in March to assess the vulva again and possibly discuss removing the area of concern.  Pt already aware of biopsy results: no evidence of dysplasia or malignancy.  Advised to call for any questions or concerns.

## 2013-09-15 ENCOUNTER — Telehealth: Payer: Self-pay | Admitting: Gynecologic Oncology

## 2013-09-15 NOTE — Telephone Encounter (Signed)
GYN ONCOLOGY CLINIC VISIT  CHIEF COMPLAINT:  Surveillance endometrial cancer   ASSESSMENT/PLAN Lisa Morales has a stage IIIA grade 1 endometrial cancer with involvement of the right ovary and fallopian tubes, 80% myometrial invasion and lymphovascular space invasion. She has completed sandwich therapy, chemotherapy punctuated by external beam therapy followed by additional chemotherapy completed.10/07/2011 No evidence of disease F/U 10/2013 with Rad onc F/U 01/2014 with Lisa Morales  Patient counseled on S/S of recurrence  HPI This is a 63 year old gravida 1, para 1, last normal menstrual period approximately 15 years ago, vaginal spotting was appreciated in June 2011. Subsequent endometrial biopsy performed by Lisa Morales was notable for grade 1 endometrial cancer. On February 19, 2011, she underwent a robotic-assisted laparoscopic hysterectomy, bilateral salpingo-oophorectomy, right pelvic lymph node sampling. 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 The tumor was grade 1 with lymphovascular space invasion and invaded myometrial wall 2 cm where the myometrium was 2.5 cm. This correlates an 80% myometrial wall thickness.Stage IIIA  Postoperatively she received sandwich therapy.  adjuvant chemotherapy  initial 3 cycles of taxotere/carboplatin followed by external beam radiation and HDR thru 08-20-2011, then additional 3 cycles of taxotere/carbo thru 10-07-2011.    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  On visit 08/2013 the lesion appears larger  A representative biopsy was collected.   Vulva, biopsy, left - REACTIVE SQUAMOUS MUCOSA WITH UNDERLYING STROMAL FIBROSIS AND ASSOCIATED MILD TO CHRONIC INFLAMMATION. - NO EVIDENCE OF DYSPLASIA OR MALIGNANCY.   PAST MEDICAL HISTORY:  1. Insulin-dependent diabetes mellitus.  2. Peptic ulcer disease.  3. Hypertension.  4. Gout.  5. Neuropathy.  6.   Stage IIIA uterine ca  PAST SURGICAL HISTORY:  1 robotic-assisted laparoscopic hysterectomy, bilateral salpingo-oophorectomy, right pelvic lymph node dissection.  01/2011  REVIEW OF SYSTEMS Constitutional  Feels well  Skin/Breast  No rash, sores, jaundice, itching, dryness, lumps, swelling, breast pain, or nipple discharge. Age spots  Cardiovascular  No chest pain, shortness of breath, or edema  Pulmonary  No cough or wheeze.  Gastro Intestinal  No nausea, vomitting, or diarrhoea. No bright red blood per rectum, no abdominal pain, change in bowel movement, or constipation.  Genito Urinary  No frequency, urgency, dysuria,  Musculo Skeletal  No myalgia, arthralgia, joint swelling or pain  Neurologic  No weakness, numbness, change in gait,  Psychology  No depression, anxiety, insomnia.   PHYSICAL EXAMINATION: GENERAL: Well-developed female, in no acute distress.   VITAL SIGNS: BP 142/81  Pulse 70  Temp(Src) 98.4 F (36.9 C) (Oral)  Resp 16  Ht 5' 6.38" (1.686 m)  Wt 244 lb 8 oz (110.904 kg)  BMI 39.02 kg/m2 HEART: RRR nl S1 S2 CHEST: Clear to auscultation.  ABDOMEN: Soft, nontender, Port sites, clean intact without erythema mass or hernia.   BACK:No CVAT LYMPH NODES:  No cervical supraclavicular or inguinal adenopathy. 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  83mm lesion noted also on the right labia minora.  The left sided lesion was was prepped with Betadine and a biopsy collected,  Hemostasis facilitated with silver nitrate.  Vaginal cuff intact, some tenderness. No discharge, no bleeding. No cul-de-sac masses.  RECTAL:  Good tone, no masses. EXT:  No clubbing cyanosis or edema bilaterally

## 2013-09-16 ENCOUNTER — Ambulatory Visit: Payer: No Typology Code available for payment source | Attending: Gynecologic Oncology | Admitting: Gynecologic Oncology

## 2013-09-16 VITALS — BP 128/69 | HR 63 | Temp 98.0°F | Resp 18 | Ht 66.38 in | Wt 242.6 lb

## 2013-09-16 DIAGNOSIS — I10 Essential (primary) hypertension: Secondary | ICD-10-CM | POA: Insufficient documentation

## 2013-09-16 DIAGNOSIS — G589 Mononeuropathy, unspecified: Secondary | ICD-10-CM | POA: Insufficient documentation

## 2013-09-16 DIAGNOSIS — Z9079 Acquired absence of other genital organ(s): Secondary | ICD-10-CM | POA: Insufficient documentation

## 2013-09-16 DIAGNOSIS — Z8711 Personal history of peptic ulcer disease: Secondary | ICD-10-CM | POA: Insufficient documentation

## 2013-09-16 DIAGNOSIS — Z8542 Personal history of malignant neoplasm of other parts of uterus: Secondary | ICD-10-CM | POA: Insufficient documentation

## 2013-09-16 DIAGNOSIS — Z794 Long term (current) use of insulin: Secondary | ICD-10-CM | POA: Insufficient documentation

## 2013-09-16 DIAGNOSIS — Z90710 Acquired absence of both cervix and uterus: Secondary | ICD-10-CM | POA: Insufficient documentation

## 2013-09-16 DIAGNOSIS — M109 Gout, unspecified: Secondary | ICD-10-CM | POA: Insufficient documentation

## 2013-09-16 DIAGNOSIS — N904 Leukoplakia of vulva: Secondary | ICD-10-CM | POA: Insufficient documentation

## 2013-09-16 DIAGNOSIS — E119 Type 2 diabetes mellitus without complications: Secondary | ICD-10-CM | POA: Insufficient documentation

## 2013-09-16 DIAGNOSIS — C541 Malignant neoplasm of endometrium: Secondary | ICD-10-CM

## 2013-09-16 DIAGNOSIS — C549 Malignant neoplasm of corpus uteri, unspecified: Secondary | ICD-10-CM | POA: Insufficient documentation

## 2013-09-16 NOTE — Progress Notes (Signed)
GYN ONCOLOGY CLINIC VISIT  CHIEF COMPLAINT:  Surveillance endometrial cancer/ vulvar dystrophy   ASSESSMENT/PLAN Lisa Morales has a stage IIIA grade 1 endometrial cancer with involvement of the right ovary and fallopian tubes, 80% myometrial invasion and lymphovascular space invasion. She has completed sandwich therapy, chemotherapy punctuated by external beam therapy followed by additional chemotherapy completed.10/07/2011 No evidence of disease F/U 10/2013 with Rad onc F/U 01/2014 with Lisa Morales F/U 05/2014 with Gyn Onc  Vulvar dystrophy.  Two biopsies without evidence of dysplasia. Patient advised to apply desitin/AD ointment to protect the skin from urinary incontinence.  If the lesions are not improved with better skin hygiene will recommend resection if still present 05/2014.  Lisa Morales concurs with the plan  HPI This is a 63 year old gravida 1, para 1, last normal menstrual period approximately 15 years ago, vaginal spotting was appreciated in June 2011. Subsequent endometrial biopsy performed by Lisa Morales was notable for grade 1 endometrial cancer. On February 19, 2011, she underwent a robotic-assisted laparoscopic hysterectomy, bilateral salpingo-oophorectomy, right pelvic lymph node sampling. 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 The tumor was grade 1 with lymphovascular space invasion and invaded myometrial wall 2 cm where the myometrium was 2.5 cm. This correlates an 80% myometrial wall thickness.Stage IIIA  Postoperatively she received sandwich therapy.  adjuvant chemotherapy  initial 3 cycles of taxotere/carboplatin followed by external beam radiation and HDR thru 08-20-2011, then additional 3 cycles of taxotere/carbo thru 10-07-2011.    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  On visit 08/2013 the lesion appears larger  A representative biopsy was collected.   Vulva, biopsy,  left - REACTIVE SQUAMOUS MUCOSA WITH UNDERLYING STROMAL FIBROSIS AND ASSOCIATED MILD TO CHRONIC INFLAMMATION. - NO EVIDENCE OF DYSPLASIA OR MALIGNANCY.   PAST MEDICAL HISTORY:  1. Insulin-dependent diabetes mellitus.  2. Peptic ulcer disease.  3. Hypertension.  4. Gout.  5. Neuropathy.  6.  Stage IIIA uterine ca  PAST SURGICAL HISTORY:  1 robotic-assisted laparoscopic hysterectomy, bilateral salpingo-oophorectomy, right pelvic lymph node dissection.  01/2011  REVIEW OF SYSTEMS Constitutional  Feels well  Cardiovascular  No chest pain, shortness of breath, or edema  Pulmonary  No cough or wheeze.  Gastro Intestinal  No nausea, vomitting, or diarrhoea. No bright red blood per rectum, no abdominal pain, change in bowel movement, or constipation.  Genito Urinary  No frequency, urgency, dysuria, Reports incontinence with stress, reports post void dribbling  PHYSICAL EXAMINATION: GENERAL: Well-developed female, in no acute distress. Wt Readings from Last 3 Encounters:  09/16/13 242 lb 9.6 oz (110.043 kg)  08/12/13 244 lb 8 oz (110.904 kg)  05/13/13 252 lb (114.306 kg)      VITAL SIGNS: BP 128/69  Pulse 63  Temp(Src) 98 F (36.7 C)  Resp 18  Ht 5' 6.38" (1.686 m)  Wt 242 lb 9.6 oz (110.043 kg)  BMI 38.71 kg/m2 HEART: RRR nl S1 S2 CHEST: Clear to auscultation.  ABDOMEN: Soft, nontender, Port sites, clean intact without erythema mass or hernia.   BACK:No CVAT LYMPH NODES:  No cervical supraclavicular or inguinal adenopathy. 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 No lesions on the right labia minora.  No evidence of urethral diverticulum EXT:  No clubbing cyanosis or edema bilaterally

## 2013-09-16 NOTE — Patient Instructions (Signed)
No evidence of disease Follow-up with Rad onc Follow-up 01/2014 with Dr. Harrington Challenger Follow-up 05/2014 with [facility] to labia daily   Wt Readings from Last 3 Encounters:  09/16/13 242 lb 9.6 oz (110.043 kg)  08/12/13 244 lb 8 oz (110.904 kg)  05/13/13 252 lb (114.306 kg)

## 2013-11-11 ENCOUNTER — Ambulatory Visit: Payer: BC Managed Care – PPO | Admitting: Radiation Oncology

## 2013-11-18 ENCOUNTER — Encounter: Payer: Self-pay | Admitting: Radiation Oncology

## 2013-11-18 ENCOUNTER — Other Ambulatory Visit (HOSPITAL_COMMUNITY)
Admission: RE | Admit: 2013-11-18 | Discharge: 2013-11-18 | Disposition: A | Discharge status: Home / Self Care | Payer: No Typology Code available for payment source | Source: Ambulatory Visit | Attending: Radiation Oncology | Admitting: Radiation Oncology

## 2013-11-18 ENCOUNTER — Ambulatory Visit
Admission: RE | Admit: 2013-11-18 | Discharge: 2013-11-18 | Disposition: A | Discharge status: Home / Self Care | Payer: No Typology Code available for payment source | Source: Ambulatory Visit | Attending: Radiation Oncology | Admitting: Radiation Oncology

## 2013-11-18 VITALS — BP 117/46 | HR 73 | Temp 97.9°F | Ht 66.38 in | Wt 246.0 lb

## 2013-11-18 DIAGNOSIS — Z01419 Encounter for gynecological examination (general) (routine) without abnormal findings: Secondary | ICD-10-CM | POA: Insufficient documentation

## 2013-11-18 DIAGNOSIS — C541 Malignant neoplasm of endometrium: Secondary | ICD-10-CM

## 2013-11-18 NOTE — Progress Notes (Signed)
Radiation Oncology         [phone]) [phone] ________________________________  Name: Lisa Morales MRN: [id number]  Date: 11/18/2013  DOB: Oct 16, 1950  Follow-Up Visit Note  CC: PROVIDER NOT IN SYSTEM  Gordy Levan, MD  Diagnosis:   Stage IIIa endometrial cancer  Interval Since Last Radiation:  2 years and 3 months   Narrative:  The patient returns today for routine follow-up.  She is doing well without any new medical issues. 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 She continues to followup with Dr. Harrington Challenger and gynecologic oncology. In February patient did undergo biopsy of her left labia minora which returned reactive squamous mucosa, no evidence of dysplasia or malignancy. It was recommended patient place destin this area in light of her chronic urinary incontinence.  She denies any vaginal bleeding or hematuria. Patient denies any rectal bleeding pelvic pain or bowel complaints.                              ALLERGIES:  is allergic to adhesive; aspirin; and naproxen.  Meds: Current Outpatient Prescriptions  Medication Sig Dispense Refill  . allopurinol (ZYLOPRIM) 300 MG tablet Take 300 mg by mouth daily.        Marland Kitchen atenolol (TENORMIN) 100 MG tablet Take 100 mg by mouth daily.        . diphenhydramine-acetaminophen (TYLENOL PM) 25-500 MG TABS Take 1 tablet by mouth at bedtime as needed.        . gabapentin (NEURONTIN) 300 MG capsule Take 300 mg by mouth 3 (three) times daily.        . Insulin Regular Human (HUMULIN R U-500, CONCENTRATED, [place]) Inject 20 Units into the skin 2 (two) times daily before a meal. Takes before breakfast and dinner.      Marland Kitchen lisinopril (PRINIVIL,ZESTRIL) 5 MG tablet Take 5 mg by mouth daily.      Marland Kitchen loratadine (CLARITIN) 10 MG tablet Take 10 mg by mouth daily.        . Multiple Vitamin (MULTIVITAMIN PO) Take 1 tablet by mouth daily.        Marland Kitchen PARoxetine (PAXIL) 20 MG tablet Take 1 tablet (20 mg total) by mouth daily.  30 tablet  2  . traMADol  (ULTRAM) 50 MG tablet Take 50 mg by mouth every 6 (six) hours as needed.      . Vitamins A & D (VITAMIN A & D) ointment Apply 1 application topically as needed for dry skin.      Marland Kitchen ascorbic acid (VITAMIN C) 1000 MG tablet Take 1,000 mg by mouth 2 (two) times daily.        . fish oil-omega-3 fatty acids 1000 MG capsule Take 1 g by mouth daily.         No current facility-administered medications for this encounter.    Physical Findings: The patient is in no acute distress. Patient is alert and oriented.  height is 5' 6.38" (1.686 m) and weight is 246 lb (111.585 kg). Her oral temperature is 97.9 F (36.6 C). Her blood pressure is 117/46 and her pulse is 73. Marland Kitchen No palpable supraclavicular adenopathy. The lungs are clear to auscultation. The heart has a regular rhythm and rate. Abdomen is soft and nontender with normal bowel sounds. The inguinal areas are free of adenopathy. On pelvic examination the patient has an area of leukoplakia extending over approximately 1 x 2 cm. This is along the left  posterior labia minora.  No lesions are noted along the right labia.  A speculum Exam is performed. There is some mild radiation changes noted in the proximal vagina. No mucosal lesions noted.  A Pap smear was obtained of the proximal vagina. On bimanual and rectovaginal examination there no pelvic masses appreciated.  Lab Findings: Lab Results  Component Value Date   WBC 8.4 02/24/2012   HGB 12.0 02/24/2012   HCT 36.2 02/24/2012   MCV 92.3 02/24/2012   PLT 276 02/24/2012     Radiographic Findings: No results found.  Impression:  No evidence of recurrence on clinical exam today, Pap smear pending  Plan:  Routine followup in February of 2016. In the interim the patient will be seen by Dr. Harrington Challenger and Dr. Skeet Latch.  ____________________________________ Blair Promise, MD

## 2013-11-18 NOTE — Addendum Note (Signed)
Encounter addended by: Blair Promise, MD on: 11/18/2013  3:19 PM<BR>     Documentation filed: Orders

## 2013-11-18 NOTE — Progress Notes (Signed)
Lisa Morales here for follow up after treatment to her pelvis.  She reports pain due to neuropathy in her feet.  She is taking gabapentin TID.  She reports incontinence of urine that is causing a "white" area in her vaginal area.  She is using A&D ointment as needed per Dr. Skeet Latch.  She said that if it not gone when she sees Dr. Skeet Latch at her next appointment, she was going to "zap" it.  She reports having nausea and diarrhea a few days ago due to something she ate.  She denies rectal bleeding.  She denies fatigue.

## 2013-11-25 ENCOUNTER — Telehealth: Payer: Self-pay | Admitting: Oncology

## 2013-11-25 NOTE — Telephone Encounter (Signed)
Called and left Lisa Morales a message to call back regarding her pap smear results.

## 2013-11-29 NOTE — Telephone Encounter (Signed)
Lisa Morales called back today.  Let her know that the results of her pap smear were good per Dr. Sondra Come.  [name] verbalized agreement.

## 2014-05-02 ENCOUNTER — Encounter: Payer: Self-pay | Admitting: Radiation Oncology

## 2014-08-10 ENCOUNTER — Other Ambulatory Visit (HOSPITAL_COMMUNITY)
Admission: RE | Admit: 2014-08-10 | Discharge: 2014-08-10 | Disposition: A | Discharge status: Home / Self Care | Payer: 59 | Source: Ambulatory Visit | Attending: Radiation Oncology | Admitting: Radiation Oncology

## 2014-08-10 ENCOUNTER — Ambulatory Visit
Admission: RE | Admit: 2014-08-10 | Discharge: 2014-08-10 | Disposition: A | Discharge status: Home / Self Care | Payer: 59 | Source: Ambulatory Visit | Attending: Radiation Oncology | Admitting: Radiation Oncology

## 2014-08-10 ENCOUNTER — Encounter: Payer: Self-pay | Admitting: Radiation Oncology

## 2014-08-10 VITALS — BP 154/65 | HR 72 | Temp 97.8°F | Resp 20 | Ht 66.0 in | Wt 247.8 lb

## 2014-08-10 DIAGNOSIS — Z01411 Encounter for gynecological examination (general) (routine) with abnormal findings: Secondary | ICD-10-CM | POA: Insufficient documentation

## 2014-08-10 DIAGNOSIS — C549 Malignant neoplasm of corpus uteri, unspecified: Secondary | ICD-10-CM

## 2014-08-10 NOTE — Progress Notes (Signed)
Lisa Morales here for follow up after treatment for endometrial cancer.  She continues to have pain from peripheral neuropathy in both feet.  She is rating it at a 8/10 and is taking gabapentin and tramadol.  She denies bowel issues except for occasional constipation.  She continues to have incontinence of urine and is wearing depends.  She denies vaginal discharge and bleeding.  She denies nausea and has a good appetite.  She denies fatigue.    BP 154/65 mmHg  Pulse 72  Temp(Src) 97.8 F (36.6 C) (Oral)  Resp 20  Ht 5\' 6"  (1.676 m)  Wt 247 lb 12.8 oz (112.401 kg)  BMI 40.01 kg/m2  SpO2 96%

## 2014-08-10 NOTE — Progress Notes (Signed)
Radiation Oncology         [phone] ________________________________  Name: Lisa Morales MRN: [id number]  Date: 08/10/2014  DOB: [birth year]  Follow-Up Visit Note  CC: PROVIDER NOT IN SYSTEM  Livesay, Tamala Julian, MD    ICD-9-CM ICD-10-CM   1. Malignant neoplasm of corpus uteri, except isthmus 182.0 C54.9 Cytology - PAP    Diagnosis:  Stage IIIa endometrial cancer  Interval Since Last Radiation:  3 years, the patient completed external beam radiation therapy and intracavitary brachytherapy treatments.  Narrative:  The patient returns today for routine follow-up.  She continues to have peripheral neuropathy related to her diabetes. 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 She denies any pelvic pain or vaginal bleeding. She continues to have some problems with urinary incontinence and uses approximately 1 pad per day. She denies any hematuria or rectal bleeding or vaginal bleeding.                              ALLERGIES:  is allergic to adhesive; aspirin; and naproxen.  Meds: Current Outpatient Prescriptions  Medication Sig Dispense Refill  . ascorbic acid (VITAMIN C) 1000 MG tablet Take 1,000 mg by mouth 2 (two) times daily.      Marland Kitchen atenolol (TENORMIN) 100 MG tablet Take 100 mg by mouth daily.      Marland Kitchen atorvastatin (LIPITOR) 10 MG tablet Take 10 mg by mouth daily.    . diphenhydramine-acetaminophen (TYLENOL PM) 25-500 MG TABS Take 1 tablet by mouth at bedtime as needed.      . gabapentin (NEURONTIN) 300 MG capsule Take 300 mg by mouth 3 (three) times daily.      . Insulin Regular Human (HUMULIN R U-500, CONCENTRATED, [place]) Inject 20 Units into the skin 2 (two) times daily before a meal. Takes before breakfast and dinner.    Marland Kitchen lisinopril (PRINIVIL,ZESTRIL) 5 MG tablet Take 5 mg by mouth daily.    Marland Kitchen loratadine (CLARITIN) 10 MG tablet Take 10 mg by mouth daily.      . Multiple Vitamin  (MULTIVITAMIN PO) Take 1 tablet by mouth daily.      . traMADol (ULTRAM) 50 MG tablet Take 50 mg by mouth every 6 (six) hours as needed.    . Vitamins A & D (VITAMIN A & D) ointment Apply 1 application topically as needed for dry skin.    Marland Kitchen acetaminophen (TYLENOL) 325 MG tablet Take by mouth.    Marland Kitchen allopurinol (ZYLOPRIM) 300 MG tablet Take 300 mg by mouth daily.      . fish oil-omega-3 fatty acids 1000 MG capsule Take 1 g by mouth daily.      Marland Kitchen PARoxetine (PAXIL) 20 MG tablet Take 1 tablet (20 mg total) by mouth daily. (Patient not taking: Reported on 08/10/2014) 30 tablet 2   No current facility-administered medications for this encounter.    Physical Findings: The patient is in no acute distress. Patient is alert and oriented.  height is 5\' 6"  (1.676 m) and weight is 247 lb 12.8 oz (112.401 kg). Her oral temperature is 97.8 F (36.6 C). Her blood pressure is 154/65 and her pulse is 72. Her respiration is 20 and oxygen saturation is 96%. .  No palpable subclavicular or axillary adenopathy. The lungs are clear to auscultation. The heart has a regular rhythm and rate. The abdomen  is soft and nontender with normal bowel sounds. No inguinal adenopathy is appreciated. 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 On speculum examination radiation changes are noted in the proximal vagina. No mucosal lesions noted. A Pap smear was obtained of the proximal vagina. On bimanual and rectovaginal examination there no pelvic masses appreciated  Lab Findings: Lab Results  Component Value Date   WBC 8.4 02/24/2012   HGB 12.0 02/24/2012   HCT 36.2 02/24/2012   MCV 92.3 02/24/2012   PLT 276 02/24/2012    Radiographic Findings: No results found.  Impression:  No evidence of recurrence on clinical exam today, Pap smear pending  Plan:  Routine follow-up in 6 months. In reviewing the patient's chart she did not follow-up with gynecologic oncology concerning  her vulvar lesion. We are scheduling the patient with Dr. Skeet Latch to be seen as soon possible concerning this issue.  ____________________________________ Blair Promise, MD

## 2014-08-16 LAB — CYTOLOGY - PAP

## 2014-08-17 ENCOUNTER — Telehealth: Payer: Self-pay | Admitting: Oncology

## 2014-08-17 NOTE — Telephone Encounter (Signed)
Notified Lisa Morales of her good pap smear results per Dr. Sondra Come.  Lisa Morales verbalized understanding.

## 2014-09-08 ENCOUNTER — Ambulatory Visit: Payer: Self-pay | Admitting: Radiation Oncology

## 2014-10-19 ENCOUNTER — Telehealth: Payer: Self-pay | Admitting: *Deleted

## 2014-10-19 NOTE — Telephone Encounter (Signed)
Pt left message to cancel her appt scheduled on 10/20/14 @ 11:00 stating " My daugther's dog knocked me down I'm to sore to come to my appointment". Pt was called to reschedule her appointment. Pt's husband stated " my wife is sleep but  Iwill tell her to call back to reschedule her appointment".

## 2014-10-20 ENCOUNTER — Ambulatory Visit: Payer: 59 | Admitting: Gynecologic Oncology

## 2014-12-08 ENCOUNTER — Ambulatory Visit: Payer: 59 | Attending: Gynecologic Oncology | Admitting: Gynecologic Oncology

## 2014-12-08 ENCOUNTER — Encounter: Payer: Self-pay | Admitting: Gynecologic Oncology

## 2014-12-08 VITALS — BP 141/60 | HR 70 | Temp 97.5°F | Resp 20 | Ht 66.0 in | Wt 241.6 lb

## 2014-12-08 DIAGNOSIS — Z9221 Personal history of antineoplastic chemotherapy: Secondary | ICD-10-CM

## 2014-12-08 DIAGNOSIS — Z923 Personal history of irradiation: Secondary | ICD-10-CM

## 2014-12-08 DIAGNOSIS — N904 Leukoplakia of vulva: Secondary | ICD-10-CM | POA: Diagnosis not present

## 2014-12-08 DIAGNOSIS — Z8542 Personal history of malignant neoplasm of other parts of uterus: Secondary | ICD-10-CM | POA: Diagnosis not present

## 2014-12-08 DIAGNOSIS — C541 Malignant neoplasm of endometrium: Secondary | ICD-10-CM

## 2014-12-08 NOTE — Patient Instructions (Signed)
We will contact you with the results of your biopsy from today.  Treatment options if the area is pre-cancerous include wide local excision or surgical removal of the area.  Follow in six months if no intervention is needed based on biopsy results (please call closer to the date).  Please call for any questions or concerns.

## 2014-12-08 NOTE — Addendum Note (Signed)
Addended by: Christa See on: 12/08/2014 04:15 PM   Modules accepted: Orders

## 2014-12-08 NOTE — Progress Notes (Addendum)
GYN ONCOLOGY CLINIC VISIT  CHIEF COMPLAINT:  Surveillance endometrial cancer/ vulvar dystrophy   ASSESSMENT/PLAN Lisa Morales has a stage IIIA grade 1 endometrial cancer with involvement of the right ovary and fallopian tubes, 80% myometrial invasion and lymphovascular space invasion. 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 Hygiene improved but the abnormal area has expanded.  Biopsies repeated.   Option if not cancer Clobetasol versus excision If neoplasia will perform WLE.   Lisa Morales concurs with the plan  HPI This is a 64 year old gravida 1, para 1, last normal menstrual period approximately 15 years ago, vaginal spotting was appreciated in June 2011. Subsequent endometrial biopsy performed by Dr. Harrington Morales was notable for grade 1 endometrial cancer. On February 19, 2011, she underwent a robotic-assisted laparoscopic hysterectomy, bilateral salpingo-oophorectomy, right pelvic lymph node sampling. 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 The tumor was grade 1 with lymphovascular space invasion and invaded myometrial wall 2 cm where the myometrium was 2.5 cm. This correlates an 80% myometrial wall thickness.Stage IIIA  Postoperatively she received sandwich therapy.  adjuvant chemotherapy  initial 3 cycles of taxotere/carboplatin followed by external beam radiation and HDR thru 08-20-2011, then additional 3 cycles of taxotere/carbo thru 10-07-2011.    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   On visit 08/2013 the lesion appears larger  A representative biopsy was collected.   Vulva, biopsy, left - REACTIVE SQUAMOUS MUCOSA WITH UNDERLYING STROMAL FIBROSIS AND ASSOCIATED MILD TO  CHRONIC INFLAMMATION. - NO EVIDENCE OF DYSPLASIA OR MALIGNANCY. Lesion appears larger today.   PAST MEDICAL HISTORY:  1. Insulin-dependent diabetes mellitus.  2. Peptic ulcer disease.  3. Hypertension.  4. Gout.  5. Neuropathy.  6.  Stage IIIA uterine ca  PAST SURGICAL HISTORY:   robotic-assisted laparoscopic hysterectomy, bilateral salpingo-oophorectomy, right pelvic lymph node dissection.  01/2011  SOCIAL HISTORY: Husband is doing well.  25 year ol daughter will be ordained as a Designer, television/film set next week.  Lisa Morales is so proud of her daughter.    REVIEW OF SYSTEMS Constitutional  Feels well  Cardiovascular  No chest pain, shortness of breath, or edema  Pulmonary  No cough or wheeze.  Gastro Intestinal  No nausea, vomitting, or diarrhoea. No bright red blood per rectum, no abdominal pain, change in bowel movement, or constipation.  Genito Urinary  No frequency, urgency, dysuria, no pruritis.  Reports incontinence with stress, reports post void dribbling  PHYSICAL EXAMINATION: GENERAL: Well-developed female, in no acute distress. Wt Readings from Last 3 Encounters:  12/08/14 109.589 kg (241 lb 9.6 oz)  08/10/14 112.401 kg (247 lb 12.8 oz)  11/18/13 111.585 kg (246 lb)      VITAL SIGNS: BP 141/60 mmHg  Pulse 70  Temp(Src) 97.5 F (36.4 C) (Oral)  Resp 20  Ht 5\' 6"  (1.676 m)  Wt 109.589 kg (241 lb 9.6 oz)  BMI 39.01 kg/m2  SpO2 98% HEART: RRR nl S1 S2 CHEST: Clear to auscultation.  ABDOMEN: Soft, nontender, Port sites, clean intact without erythema mass or hernia.   BACK:No CVAT LYMPH NODES:  No cervical supraclavicular or inguinal adenopathy. PELVIC EXAMINATION: 1 x 2.5 cm superficial area of leukoplakia noted on the left labia minora. No lesions on the right labia minora.  No evidence of urethral diverticulum.  Vagina foreshortened, atrophic no lesions no cul de sac masses. Rectal:  Good tone no masses BACK:  No CVAT   VULVAR BIOPSY  Procedure  explained to patient.  The left labia minora/intoital area was prepped with betadine.  The area was infiltrated with lidocaine.  A 33mm punch biopsy was taken.  There was minimal bleeding that was  controlled with silver nitrate and pressure for three minutes.  . Patient tolerated the procedure well.   Area prepped with betadine and  EXT:  No clubbing cyanosis or edema bilaterally

## 2014-12-15 ENCOUNTER — Other Ambulatory Visit: Payer: Self-pay | Admitting: *Deleted

## 2014-12-15 DIAGNOSIS — L900 Lichen sclerosus et atrophicus: Secondary | ICD-10-CM

## 2014-12-15 MED ORDER — CLOBETASOL PROPIONATE 0.05 % EX OINT: 1.0000 "application " | TOPICAL_OINTMENT | Freq: Two times a day (BID) | CUTANEOUS | Status: DC

## 2014-12-20 ENCOUNTER — Telehealth: Payer: Self-pay | Admitting: *Deleted

## 2014-12-20 NOTE — Telephone Encounter (Signed)
Per Dr. Skeet Latch, patient notified of labial biopsy results and prescription sent to patient's pharmacy for clobetasol ointment. Patient agreeable to pick-up medication. Denies any further questions or concerns at this time.

## 2014-12-26 ENCOUNTER — Other Ambulatory Visit: Payer: Self-pay | Admitting: *Deleted

## 2014-12-26 DIAGNOSIS — L900 Lichen sclerosus et atrophicus: Secondary | ICD-10-CM

## 2014-12-26 MED ORDER — CLOBETASOL PROPIONATE 0.05 % EX OINT: 1.0000 "application " | TOPICAL_OINTMENT | Freq: Two times a day (BID) | CUTANEOUS | Status: AC

## 2014-12-26 MED ORDER — CLOBETASOL PROPIONATE 0.05 % EX OINT: 1.0000 "application " | TOPICAL_OINTMENT | Freq: Two times a day (BID) | CUTANEOUS | Status: DC

## 2014-12-26 NOTE — Telephone Encounter (Signed)
Pt Rx for Clobetasol denied. Per [facility] pt Rx plan aprroved for 15. New Rx e-scribed to pt pharmacy

## 2015-02-09 ENCOUNTER — Ambulatory Visit
Admission: RE | Admit: 2015-02-09 | Discharge: 2015-02-09 | Disposition: A | Discharge status: Home / Self Care | Payer: 59 | Source: Ambulatory Visit | Attending: Radiation Oncology | Admitting: Radiation Oncology

## 2015-02-09 ENCOUNTER — Telehealth: Payer: Self-pay | Admitting: Oncology

## 2015-02-09 NOTE — Telephone Encounter (Signed)
Left a message for Lisa Morales regarding her missed follow up appointment with Dr. Sondra Come today.  Requested a return call.

## 2015-02-10 ENCOUNTER — Ambulatory Visit: Payer: 59 | Admitting: Radiation Oncology

## 2015-02-15 ENCOUNTER — Telehealth: Payer: Self-pay | Admitting: Oncology

## 2015-02-15 NOTE — Telephone Encounter (Signed)
[place] regarding her mammogram results.  Requested a return call.

## 2015-10-03 ENCOUNTER — Other Ambulatory Visit: Payer: Self-pay | Admitting: Nephrology

## 2015-10-03 DIAGNOSIS — N183 Chronic kidney disease, stage 3 unspecified: Secondary | ICD-10-CM

## 2015-10-06 ENCOUNTER — Ambulatory Visit
Admission: RE | Admit: 2015-10-06 | Discharge: 2015-10-06 | Disposition: A | Discharge status: Home / Self Care | Payer: BLUE CROSS/BLUE SHIELD | Source: Ambulatory Visit | Attending: Nephrology | Admitting: Nephrology

## 2015-10-06 DIAGNOSIS — N183 Chronic kidney disease, stage 3 unspecified: Secondary | ICD-10-CM

## 2017-04-22 IMAGING — US US RENAL
1 series · 14 of 25 positions shown · non-contrast
Comparison: 11/26/2011

CLINICAL DATA: Chronic renal disease, known history of right
staghorn calculus

EXAM:
RENAL / URINARY TRACT ULTRASOUND COMPLETE

[Series 1: us renal · 0.26mm/px · 14 of 48 slices shown]
[im 1/48]
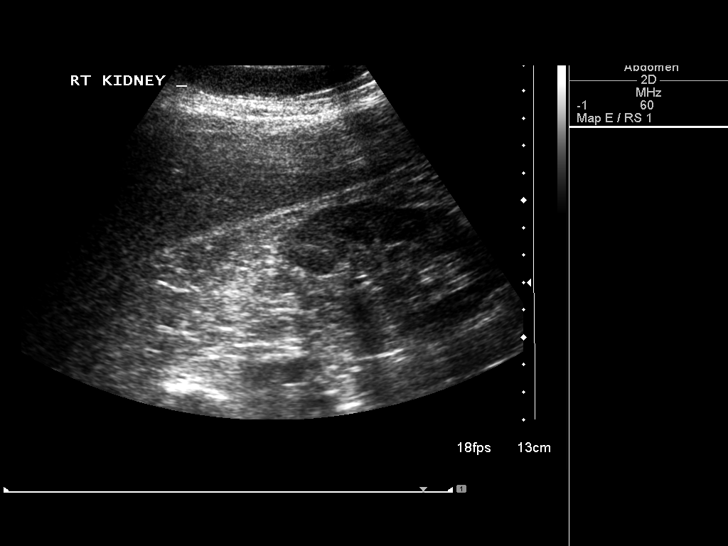
[im 4/48]
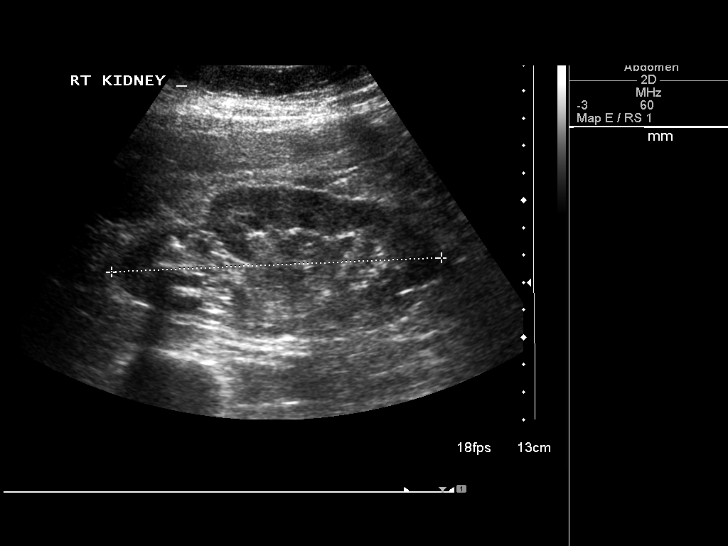
[im 8/48]
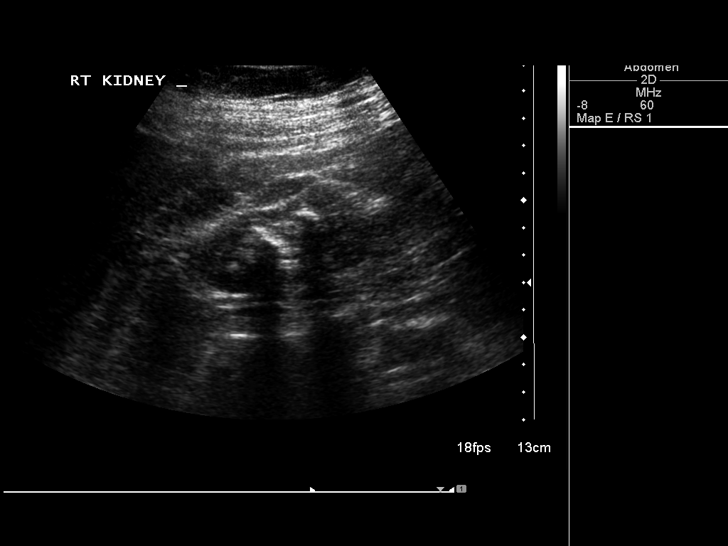
[im 12/48]
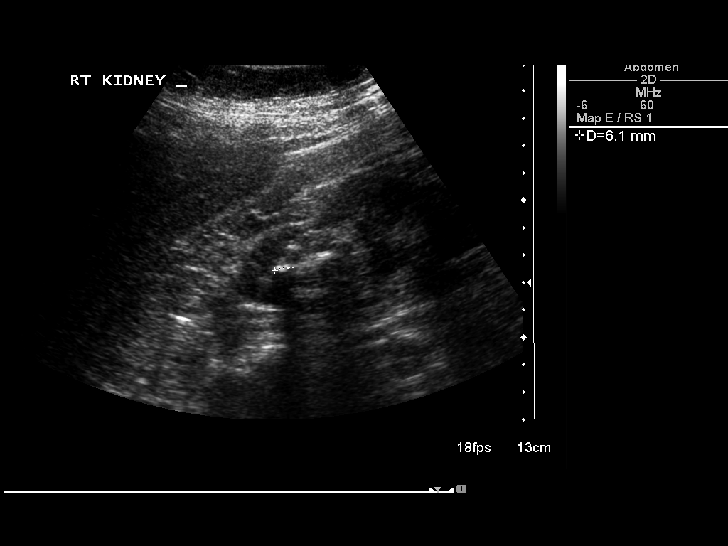
[im 16/48]
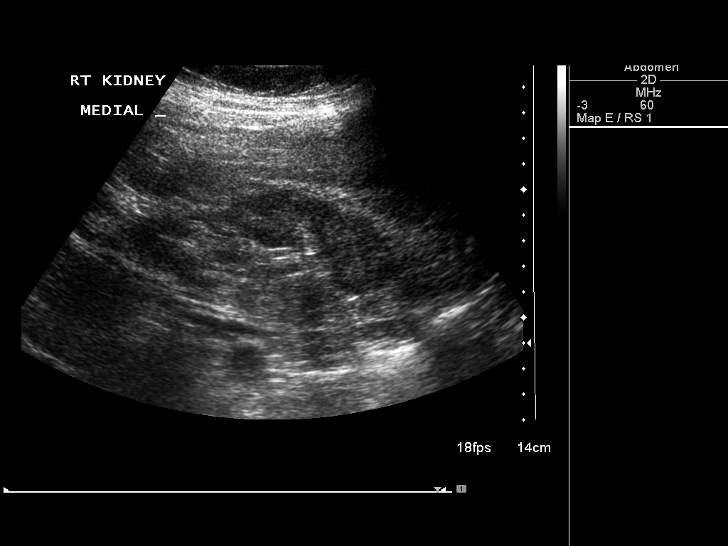
[im 18/48]
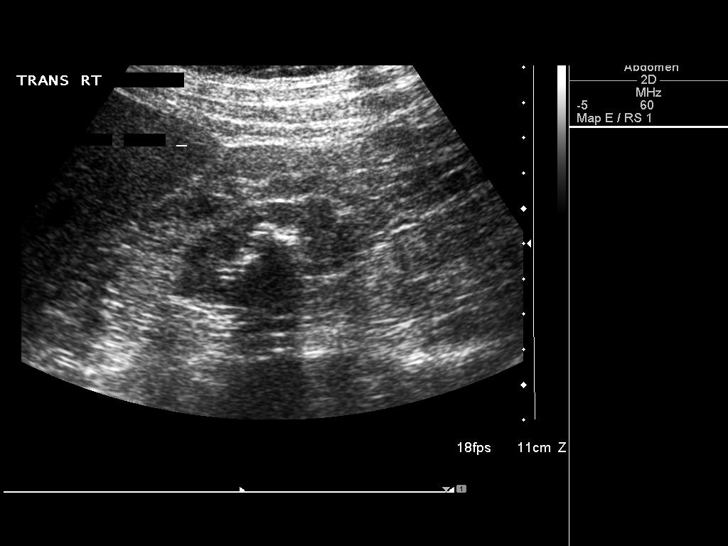
[im 22/48]
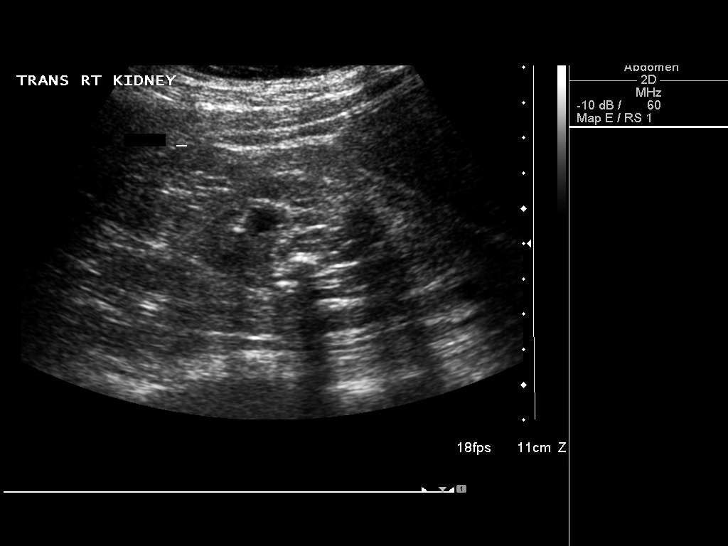
[im 26/48]
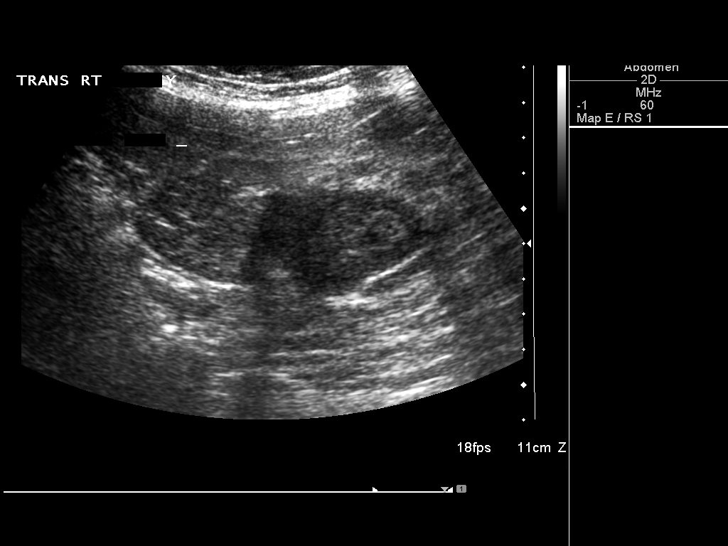
[im 30/48]
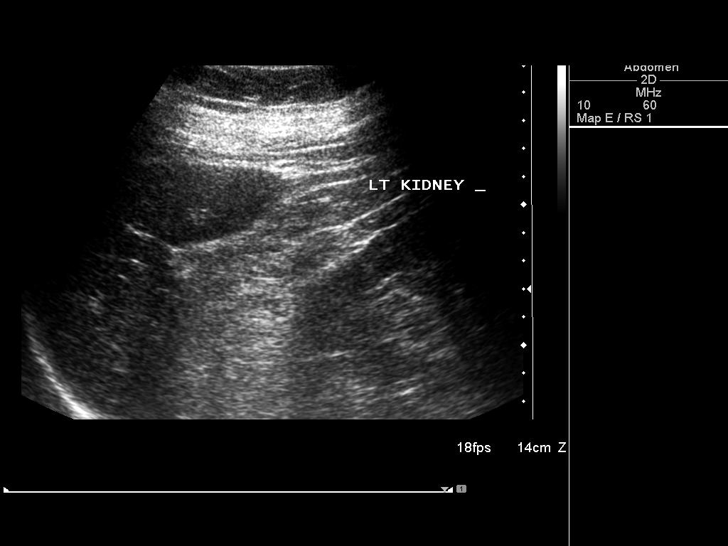
[im 32/48]
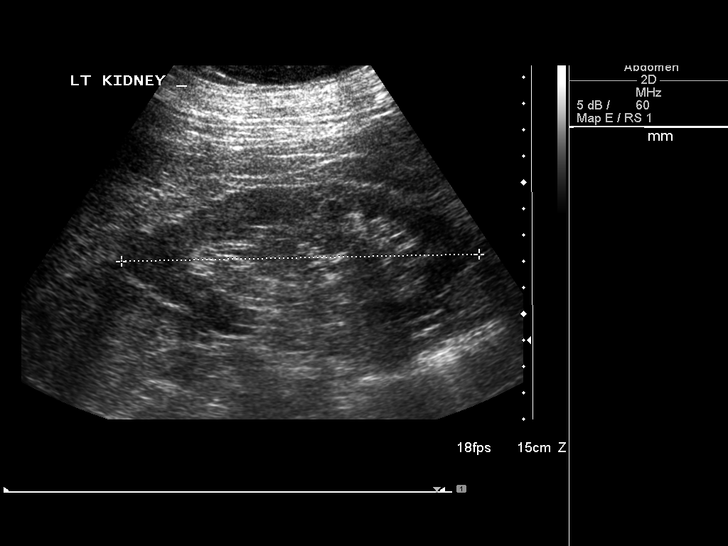
[im 36/48]
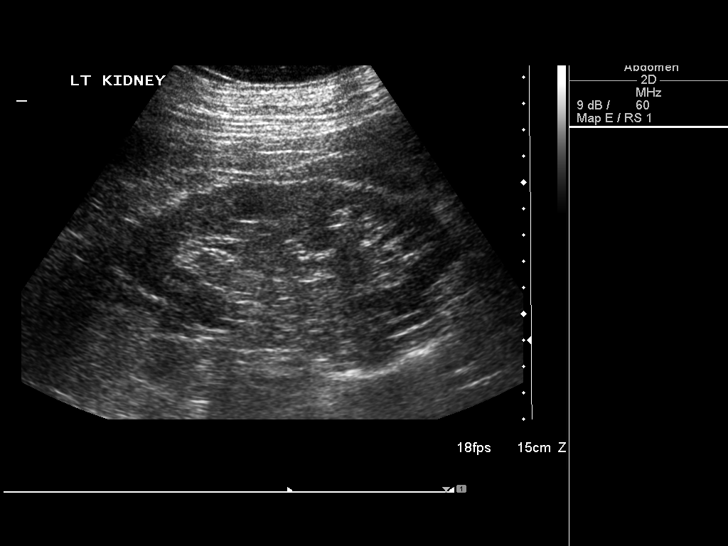
[im 40/48]
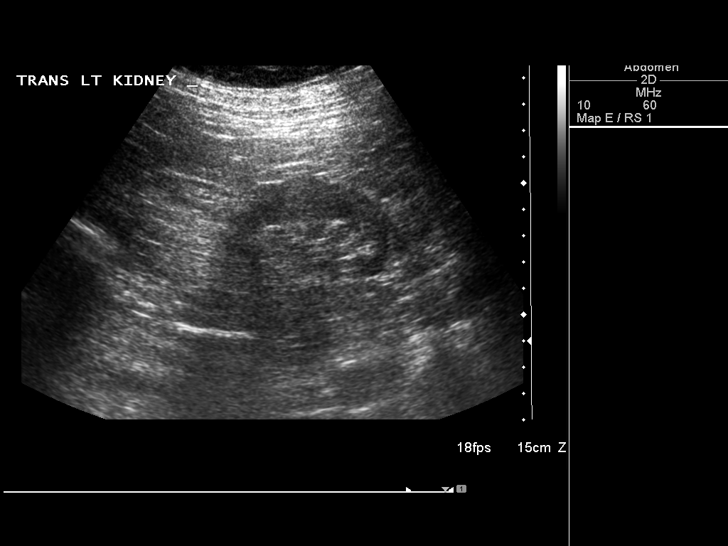
[im 44/48]
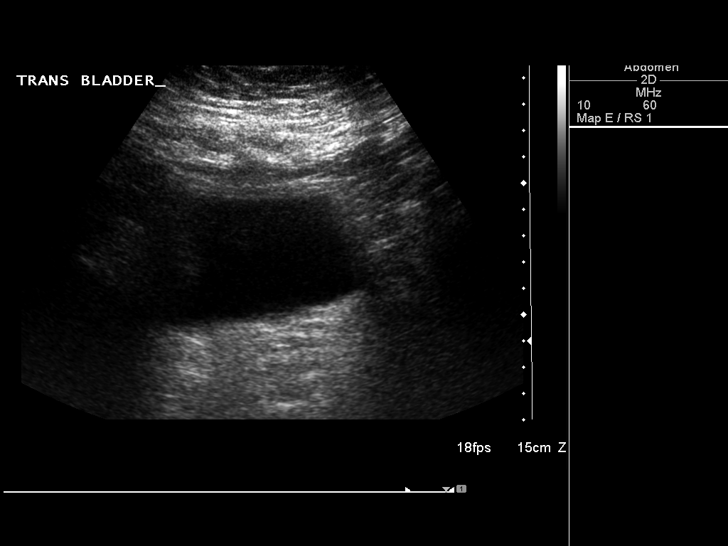
[im 48/48]
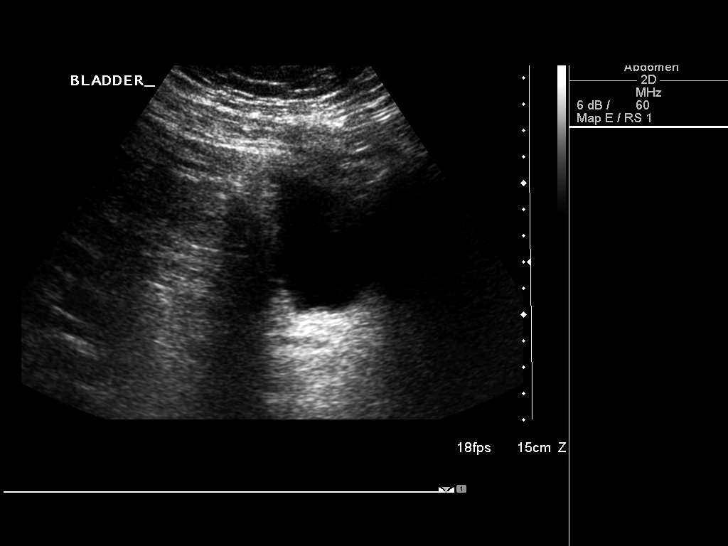

[14 of 25 positions shown; findings below may reference images not displayed]

FINDINGS: Right Kidney:

Length: 12.1 cm.. Some scarring is noted in the upper pole similar
to that seen on the prior exam. Staghorn calculus change is again
noted in the upper pole. No obstructive changes are seen. A small
cystic lesion is noted also stable from the prior study.

Left Kidney:

Length: 13.6 cm..  Mild cortical thinning.

Bladder:

Appears normal for degree of bladder distention.
IMPRESSION: Stable changes when compared with the prior exam in the right
kidney.

Mild cortical thinning in the left kidney.

No acute abnormality is noted.

## 2017-08-01 DEATH — deceased
